# Patient Record
Sex: Male | Born: 1995 | Race: White | Hispanic: No | Marital: Single | Smoking: Never smoker
Health system: Southern US, Community
[De-identification: ages and names within clinical notes are randomized; demographics above are authoritative.]

## PROBLEM LIST (undated history)

## (undated) DIAGNOSIS — F79 Unspecified intellectual disabilities: Secondary | ICD-10-CM

## (undated) DIAGNOSIS — R569 Unspecified convulsions: Secondary | ICD-10-CM

## (undated) DIAGNOSIS — F909 Attention-deficit hyperactivity disorder, unspecified type: Secondary | ICD-10-CM

---

## 2014-10-22 ENCOUNTER — Encounter: Payer: Self-pay | Admitting: *Deleted

## 2014-10-22 ENCOUNTER — Emergency Department: Payer: Medicaid Other

## 2014-10-22 ENCOUNTER — Emergency Department
Admission: EM | Admit: 2014-10-22 | Discharge: 2014-10-23 | Disposition: A | Discharge status: Home / Self Care | Payer: Medicaid Other | Attending: Emergency Medicine | Admitting: Emergency Medicine

## 2014-10-22 DIAGNOSIS — T148XXA Other injury of unspecified body region, initial encounter: Secondary | ICD-10-CM

## 2014-10-22 DIAGNOSIS — Y92410 Unspecified street and highway as the place of occurrence of the external cause: Secondary | ICD-10-CM | POA: Diagnosis not present

## 2014-10-22 DIAGNOSIS — S59901A Unspecified injury of right elbow, initial encounter: Secondary | ICD-10-CM | POA: Diagnosis present

## 2014-10-22 DIAGNOSIS — S70211A Abrasion, right hip, initial encounter: Secondary | ICD-10-CM | POA: Diagnosis not present

## 2014-10-22 DIAGNOSIS — S50311A Abrasion of right elbow, initial encounter: Secondary | ICD-10-CM | POA: Insufficient documentation

## 2014-10-22 DIAGNOSIS — Y998 Other external cause status: Secondary | ICD-10-CM | POA: Insufficient documentation

## 2014-10-22 DIAGNOSIS — S80211A Abrasion, right knee, initial encounter: Secondary | ICD-10-CM | POA: Diagnosis not present

## 2014-10-22 DIAGNOSIS — Y9389 Activity, other specified: Secondary | ICD-10-CM | POA: Diagnosis not present

## 2014-10-22 HISTORY — DX: Unspecified convulsions: R56.9

## 2014-10-22 MED ORDER — IBUPROFEN 800 MG PO TABS
800.0000 mg | ORAL_TABLET | Freq: Once | ORAL | Status: AC
Start: 2014-10-22 — End: 2014-10-22
  Administered 2014-10-22: 800 mg via ORAL
  Filled 2014-10-22: qty 1

## 2014-10-22 MED ORDER — BACITRACIN ZINC 500 UNIT/GM EX OINT
TOPICAL_OINTMENT | CUTANEOUS | Status: AC
  Administered 2014-10-22: 1 via TOPICAL
  Filled 2014-10-22: qty 0.9

## 2014-10-22 MED ORDER — BACITRACIN-NEOMYCIN-POLYMYXIN OINTMENT TUBE: TOPICAL_OINTMENT | Freq: Once | CUTANEOUS | Status: DC

## 2014-10-22 MED ORDER — NEOMYCIN-POLYMYXIN-PRAMOXINE 1 % EX CREA: TOPICAL_CREAM | Freq: Two times a day (BID) | CUTANEOUS | Status: DC

## 2014-10-22 MED ORDER — BACITRACIN ZINC 500 UNIT/GM EX OINT
TOPICAL_OINTMENT | Freq: Once | CUTANEOUS | Status: AC
  Administered 2014-10-22: 1 via TOPICAL
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
## 2014-10-22 NOTE — Discharge Instructions (Signed)
Abrasions An abrasion is a cut or scrape of the skin. Abrasions do not go through all layers of the skin. HOME CARE  If a bandage (dressing) was put on your wound, change it as told by your doctor. If the bandage sticks, soak it off with warm.  Wash the area with water and soap 2 times a day. Rinse off the soap. Pat the area dry with a clean towel.  Put on medicated cream (ointment) as told by your doctor.  Change your bandage right away if it gets wet or dirty.  Only take medicine as told by your doctor.  See your doctor within 24-48 hours to get your wound checked.  Check your wound for redness, puffiness (swelling), or yellowish-white fluid (pus). GET HELP RIGHT AWAY IF:   You have more pain in the wound.  You have redness, swelling, or tenderness around the wound.  You have pus coming from the wound.  You have a fever or lasting symptoms for more than 2-3 days.  You have a fever and your symptoms suddenly get worse.  You have a bad smell coming from the wound or bandage. MAKE SURE YOU:   Understand these instructions.  Will watch your condition.  Will get help right away if you are not doing well or get worse. Document Released: 07/12/2007 Document Revised: 10/18/2011 Document Reviewed: 12/27/2010 ExitCare Patient Information 2015 [employer]. This information is not intended to replace advice given to you by your health care provider. Make sure you discuss any questions you have with your health care provider.  

## 2014-10-23 MED ORDER — NAPROXEN 500 MG PO TABS: 500.0000 mg | ORAL_TABLET | Freq: Two times a day (BID) | ORAL | Status: DC

## 2015-07-11 ENCOUNTER — Encounter: Payer: Self-pay | Admitting: Emergency Medicine

## 2015-07-11 ENCOUNTER — Emergency Department: Payer: Medicaid Other

## 2015-07-11 ENCOUNTER — Emergency Department
Admission: EM | Admit: 2015-07-11 | Discharge: 2015-07-11 | Disposition: A | Discharge status: Home / Self Care | Payer: Medicaid Other | Attending: Emergency Medicine | Admitting: Emergency Medicine

## 2015-07-11 DIAGNOSIS — Z23 Encounter for immunization: Secondary | ICD-10-CM | POA: Diagnosis not present

## 2015-07-11 DIAGNOSIS — Z8669 Personal history of other diseases of the nervous system and sense organs: Secondary | ICD-10-CM | POA: Diagnosis not present

## 2015-07-11 DIAGNOSIS — W540XXA Bitten by dog, initial encounter: Secondary | ICD-10-CM | POA: Diagnosis not present

## 2015-07-11 DIAGNOSIS — Y939 Activity, unspecified: Secondary | ICD-10-CM | POA: Diagnosis not present

## 2015-07-11 DIAGNOSIS — Y999 Unspecified external cause status: Secondary | ICD-10-CM | POA: Diagnosis not present

## 2015-07-11 DIAGNOSIS — Y929 Unspecified place or not applicable: Secondary | ICD-10-CM | POA: Insufficient documentation

## 2015-07-11 DIAGNOSIS — S61411A Laceration without foreign body of right hand, initial encounter: Secondary | ICD-10-CM | POA: Insufficient documentation

## 2015-07-11 DIAGNOSIS — S61451A Open bite of right hand, initial encounter: Secondary | ICD-10-CM | POA: Diagnosis present

## 2015-07-11 MED ORDER — TETANUS-DIPHTH-ACELL PERTUSSIS 5-2.5-18.5 LF-MCG/0.5 IM SUSP
0.5000 mL | Freq: Once | INTRAMUSCULAR | Status: AC
  Administered 2015-07-11: 0.5 mL via INTRAMUSCULAR
  Filled 2015-07-11: qty 0.5

## 2015-07-11 MED ORDER — AMOXICILLIN-POT CLAVULANATE 875-125 MG PO TABS: 1.0000 | ORAL_TABLET | Freq: Two times a day (BID) | ORAL | Status: DC
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
## 2015-07-11 NOTE — ED Notes (Signed)
zeroform applied to wound with gauze and wrapped.

## 2015-07-11 NOTE — Discharge Instructions (Signed)
Delayed Wound Closure Sometimes, your health care provider will decide to delay closing a wound for several days. This is done when the wound is badly bruised, dirty, or when it has been several hours since the injury happened. By delaying the closure of your wound, the risk of infection is reduced. Wounds that are closed in 3-7 days after being cleaned up and dressed heal just as well as those that are closed right away. HOME CARE INSTRUCTIONS  Rest and elevate the injured area until the pain and swelling are gone.  Have your wound checked as instructed by your health care provider. SEEK MEDICAL CARE IF:  You develop unusual or increased swelling or redness around the wound.  You have increasing pain or tenderness.  There is increasing fluid (drainage) or a bad smelling drainage coming from the wound.   This information is not intended to replace advice given to you by your health care provider. Make sure you discuss any questions you have with your health care provider.   Document Released: 01/23/2005 Document Revised: 01/28/2013 Document Reviewed: 07/23/2012 Elsevier Interactive Patient Education 2016 Elsevier Inc.   Nonsutured Laceration Care A laceration is a cut that goes through all layers of the skin and extends into the tissue that is right under the skin. This type of cut is usually stitched up (sutured) or closed with tape (adhesive strips) or skin glue shortly after the injury happens. However, if the wound is dirty or if several hours pass before medical treatment is provided, it is likely that germs (bacteria) will enter the wound. Closing a laceration after bacteria have entered it increases the risk of infection. In these cases, your health care provider may leave the laceration open (nonsutured) and cover it with a bandage. This type of treatment helps prevent infection and allows the wound to heal from the deepest layer of tissue damage up to the surface. An open fracture  is a type of injury that may involve nonsutured lacerations. An open fracture is a break in a bone that happens along with one or more lacerations through the skin that is near the fracture site. HOW TO CARE FOR YOUR NONSUTURED LACERATION  Take or apply over-the-counter and prescription medicines only as told by your health care provider.  If you were prescribed an antibiotic medicine, take or apply it as told by your health care provider. Do not stop using the antibiotic even if your condition improves.  Clean the wound one time each day or as told by your health care provider.  Wash the wound with mild soap and water.  Rinse the wound with water to remove all soap.  Pat your wound dry with a clean towel. Do not rub the wound.  Do not inject anything into the wound unless your health care provider told you to.  Change any bandages (dressings) as told by your health care provider. This includes changing the dressing if it gets wet, dirty, or starts to smell bad.  Keep the dressing dry until your health care provider says it can be removed. Do not take baths, swim, or do anything that puts your wound underwater until your health care provider approves.  Raise (elevate) the injured area above the level of your heart while you are sitting or lying down, if possible.  Do not scratch or pick at the wound.  Check your wound every day for signs of infection. Watch for:  Redness, swelling, or pain.  Fluid, blood, or pus.  Keep all  follow-up visits as told by your health care provider. This is important. SEEK MEDICAL CARE IF:  You received a tetanus and shot and you have swelling, severe pain, redness, or bleeding at the injection site.   You have a fever.  Your pain is not controlled with medicine.  You have increased redness, swelling, or pain at the site of your wound.  You have fluid, blood, or pus coming from your wound.  You notice a bad smell coming from your wound or your  dressing.  You notice something coming out of the wound, such as wood or glass.  You notice a change in the color of your skin near your wound.  You develop a new rash.  You need to change the dressing frequently due to fluid, blood, or pus draining from the wound.  You develop numbness around your wound. SEEK IMMEDIATE MEDICAL CARE IF:  Your pain suddenly increases and is severe.  You develop severe swelling around the wound.  The wound is on your hand or foot and you cannot properly move a finger or toe.  The wound is on your hand or foot and you notice that your fingers or toes look pale or bluish.  You have a red streak going away from your wound.   This information is not intended to replace advice given to you by your health care provider. Make sure you discuss any questions you have with your health care provider.   Document Released: 12/21/2005 Document Revised: 06/09/2014 Document Reviewed: 01/19/2014 Elsevier Interactive Patient Education 2016 [employer]  Tdap Vaccine (Tetanus, Diphtheria and Pertussis): What You Need to Know 1. Why get vaccinated? Tetanus, diphtheria and pertussis are very serious diseases. Tdap vaccine can protect us from these diseases. And, Tdap vaccine given to pregnant women can protect newborn babies against pertussis. TETANUS (Lockjaw) is rare in the [place] today. It causes painful muscle tightening and stiffness, usually all over the body.  It can lead to tightening of muscles in the head and neck so you can't open your mouth, swallow, or sometimes even breathe. Tetanus kills about 1 out of 10 people who are infected even after receiving the best medical care. DIPHTHERIA is also rare in the [place] today. It can cause a thick coating to form in the back of the throat.  It can lead to breathing problems, heart failure, paralysis, and death. PERTUSSIS (Whooping Cough) causes severe coughing spells, which can cause difficulty  breathing, vomiting and disturbed sleep.  It can also lead to weight loss, incontinence, and rib fractures. Up to 2 in 100 adolescents and 5 in 100 adults with pertussis are hospitalized or have complications, which could include pneumonia or death. These diseases are caused by bacteria. Diphtheria and pertussis are spread from person to person through secretions from coughing or sneezing. Tetanus enters the body through cuts, scratches, or wounds. Before vaccines, as many as 200,000 cases of diphtheria, 200,000 cases of pertussis, and hundreds of cases of tetanus, were reported in the [place] each year. Since vaccination began, reports of cases for tetanus and diphtheria have dropped by about 99% and for pertussis by about 80%. 2. Tdap vaccine Tdap vaccine can protect adolescents and adults from tetanus, diphtheria, and pertussis. One dose of Tdap is routinely given at age 411 or 1612. People who did not get Tdap at that age should get it as soon as possible. Tdap is especially important for healthcare professionals and anyone having close contact with a baby  younger than 12 months. Pregnant women should get a dose of Tdap during every pregnancy, to protect the newborn from pertussis. Infants are most at risk for severe, life-threatening complications from pertussis. Another vaccine, called Td, protects against tetanus and diphtheria, but not pertussis. A Td booster should be given every 10 years. Tdap may be given as one of these boosters if you have never gotten Tdap before. Tdap may also be given after a severe cut or burn to prevent tetanus infection. Your doctor or the person giving you the vaccine can give you more information. Tdap may safely be given at the same time as other vaccines. 3. Some people should not get this vaccine  A person who has ever had a life-threatening allergic reaction after a previous dose of any diphtheria, tetanus or pertussis containing vaccine, OR has a severe  allergy to any part of this vaccine, should not get Tdap vaccine. Tell the person giving the vaccine about any severe allergies.  Anyone who had coma or long repeated seizures within 7 days after a childhood dose of DTP or DTaP, or a previous dose of Tdap, should not get Tdap, unless a cause other than the vaccine was found. They can still get Td.  Talk to your doctor if you:  have seizures or another nervous system problem,  had severe pain or swelling after any vaccine containing diphtheria, tetanus or pertussis,  ever had a condition called Guillain-Barr Syndrome (GBS),  aren't feeling well on the day the shot is scheduled. 4. Risks With any medicine, including vaccines, there is a chance of side effects. These are usually mild and go away on their own. Serious reactions are also possible but are rare. Most people who get Tdap vaccine do not have any problems with it. Mild problems following Tdap (Did not interfere with activities)  Pain where the shot was given (about 3 in 4 adolescents or 2 in 3 adults)  Redness or swelling where the shot was given (about 1 person in 5)  Mild fever of at least 100.4F (up to about 1 in 25 adolescents or 1 in 100 adults)  Headache (about 3 or 4 people in 10)  Tiredness (about 1 person in 3 or 4)  Nausea, vomiting, diarrhea, stomach ache (up to 1 in 4 adolescents or 1 in 10 adults)  Chills, sore joints (about 1 person in 10)  Body aches (about 1 person in 3 or 4)  Rash, swollen glands (uncommon) Moderate problems following Tdap (Interfered with activities, but did not require medical attention)  Pain where the shot was given (up to 1 in 5 or 6)  Redness or swelling where the shot was given (up to about 1 in 16 adolescents or 1 in 12 adults)  Fever over 102F (about 1 in 100 adolescents or 1 in 250 adults)  Headache (about 1 in 7 adolescents or 1 in 10 adults)  Nausea, vomiting, diarrhea, stomach ache (up to 1 or 3 people in  100)  Swelling of the entire arm where the shot was given (up to about 1 in 500). Severe problems following Tdap (Unable to perform usual activities; required medical attention)  Swelling, severe pain, bleeding and redness in the arm where the shot was given (rare). Problems that could happen after any vaccine:  People sometimes faint after a medical procedure, including vaccination. Sitting or lying down for about 15 minutes can help prevent fainting, and injuries caused by a fall. Tell your doctor if you feel dizzy,  or have vision changes or ringing in the ears.  Some people get severe pain in the shoulder and have difficulty moving the arm where a shot was given. This happens very rarely.  Any medication can cause a severe allergic reaction. Such reactions from a vaccine are very rare, estimated at fewer than 1 in a million doses, and would happen within a few minutes to a few hours after the vaccination. As with any medicine, there is a very remote chance of a vaccine causing a serious injury or death. The safety of vaccines is always being monitored. For more information, visit: http://floyd.org/ 5. What if there is a serious problem? What should I look for?  Look for anything that concerns you, such as signs of a severe allergic reaction, very high fever, or unusual behavior.  Signs of a severe allergic reaction can include hives, swelling of the face and throat, difficulty breathing, a fast heartbeat, dizziness, and weakness. These would usually start a few minutes to a few hours after the vaccination. What should I do?  If you think it is a severe allergic reaction or other emergency that can't wait, call 9-1-1 or get the person to the nearest hospital. Otherwise, call your doctor.  Afterward, the reaction should be reported to the Vaccine Adverse Event Reporting System (VAERS). Your doctor might file this report, or you can do it yourself through the VAERS web site at  www.vaers.LAgents.no, or by calling 1-901-125-4537. VAERS does not give medical advice.  6. The National Vaccine Injury Compensation Program The [employer] Vaccine Injury Compensation Program (VICP) is a federal program that was created to compensate people who may have been injured by certain vaccines. Persons who believe they may have been injured by a vaccine can learn about the program and about filing a claim by calling 1-707-699-1942 or visiting the VICP website at SpiritualWord.at. There is a time limit to file a claim for compensation. 7. How can I learn more?  Ask your doctor. He or she can give you the vaccine package insert or suggest other sources of information.  Call your local or state health department.  Contact the Centers for Disease Control and Prevention (CDC):  Call 347-422-1914 (1-800-CDC-INFO) or  Visit CDC's website at PicCapture.uy CDC Tdap Vaccine VIS (04/01/13)   This information is not intended to replace advice given to you by your health care provider. Make sure you discuss any questions you have with your health care provider.   Document Released: 07/25/2011 Document Revised: 02/13/2014 Document Reviewed: 05/07/2013 Elsevier Interactive Patient Education 2016 [employer].   Geophysical data processor bites can range from mild to serious. An animal bite can result in a scratch on the skin, a deep open cut, a puncture of the skin, a crush injury, or tearing away of the skin or a body part. A small bite from a house pet will usually not cause serious problems. However, some animal bites can become infected or injure a bone or other tissue.  Bites from certain animals can be more dangerous because of the risk of spreading rabies, which is a serious viral infection. This risk is higher with bites from stray animals or wild animals, such as raccoons, foxes, skunks, and bats. Dogs are responsible for most animal bites. Children are bitten more often  than adults.  SYMPTOMS  Common symptoms of an animal bite include:  Pain.  Bleeding.  Swelling.  Bruising.  DIAGNOSIS  This condition may be diagnosed based on a  physical exam and medical history. Your health care provider will examine the wound and ask for details about the animal and how the bite happened. You may also have tests, such as:  Blood tests to check for infection or to determine if surgery is needed.  X-rays to check for damage to bones or joints.  Culture test. This uses a sample of fluid from the wound to check for infection. TREATMENT  Treatment varies depending on the location and type of animal bite and your medical history. Treatment may include:  Wound care. This often includes cleaning the wound, flushing the wound with saline solution, and applying a bandage (dressing). Sometimes, the wound is left open to heal because of the high risk of infection. However, in some cases, the wound may be closed with stitches (sutures), staples, skin glue, or adhesive strips.  Antibiotic medicine.  Tetanus shot.  Rabies treatment if the animal could have rabies.  In some cases, bites that have become infected may require IV antibiotics and surgical treatment in the hospital.  HOME CARE INSTRUCTIONS  Wound Care  Follow instructions from your health care provider about how to take care of your wound. Make sure you:  Wash your hands with soap and water before you change your dressing. If soap and water are not available, use hand sanitizer.  Change your dressing as told by your health care provider.  Leave sutures, skin glue, or adhesive strips in place. These skin closures may need to be in place for 2 weeks or longer. If adhesive strip edges start to loosen and curl up, you may trim the loose edges. Do not remove adhesive strips completely unless your health care provider tells you to do that. Check your wound every day for signs of infection. Watch for:  Increasing redness, swelling,  or pain.  Fluid, blood, or pus.  General Instructions  Take or apply over-the-counter and prescription medicines only as told by your health care provider.  If you were prescribed an antibiotic, take or apply it as told by your health care provider. Do not stop using the antibiotic even if your condition improves.  Keep the injured area raised (elevated) above the level of your heart while you are sitting or lying down, if this is possible.  If directed, apply ice to the injured area.  Put ice in a plastic bag.  Place a towel between your skin and the bag.  Leave the ice on for 20 minutes, 2-3 times per day.  Keep all follow-up visits as told by your health care provider. This is important.  SEEK MEDICAL CARE IF:  You have increasing redness, swelling, or pain at the site of your wound.  You have a general feeling of sickness (malaise).  You feel nauseous or you vomit.  You have pain that does not get better.  SEEK IMMEDIATE MEDICAL CARE IF:  You have a red streak extending away from your wound.  You have fluid, blood, or pus coming from your wound.  You have a fever or chills.  You have trouble moving your injured area.  You have numbness or tingling extending beyond the wound. This information is not intended to replace advice given to you by your health care provider. Make sure you discuss any questions you have with your health care provider.  Document Released: 10/11/2010 Document Revised: 10/14/2014 Document Reviewed: 06/10/2014  Elsevier Interactive Patient Education [employer]

## 2015-07-11 NOTE — ED Provider Notes (Signed)
[facility] Emergency Department Provider Note  ____________________________________________  Time seen: Approximately 4:43 PM  I have reviewed the triage vital signs and the nursing notes.   HISTORY  Chief Complaint Animal Bite    HPI [name] is a 20 y.o. male , NAD, presents to the emergency department accompanied by his father who assists with history due to the patient having a developmental disability. Patient's father reports that [name] was bitten on the right hand by a family dog when he was trying to untangle the animal from a chain in which it was tethered to a tree. They report the animal is up-to-date on all vaccinations. [name] has not had a tetanus vaccine in approximately 10 years. They've noticed there is pain about the site of the bite but otherwise has had full range of motion of his right hand and fingers. Dog bite has not been reported to animal control at this time.    Past Medical History  Diagnosis Date  . Seizures (HCC)     There are no active problems to display for this patient.   History reviewed. No pertinent past surgical history.  Current Outpatient Rx  Name  Route  Sig  Dispense  Refill  . amoxicillin-clavulanate (AUGMENTIN) 875-125 MG tablet   Oral   Take 1 tablet by mouth 2 (two) times daily.   20 tablet   0     Allergies Review of patient's allergies indicates no known allergies.  No family history on file.  Social History Social History  Substance Use Topics  . Smoking status: Never Smoker   . Smokeless tobacco: None  . Alcohol Use: No     Review of Systems  Constitutional: No fever/chills Cardiovascular: No chest pain. Respiratory: No shortness of breath. No wheezing.  Gastrointestinal: No abdominal pain.  No nausea, vomiting. Musculoskeletal: Positive right hand pain. Skin: Positive open wound to right hand due to dog bite. Negative for rash. Neurological: Negative for headaches, focal  weakness or numbness. No tingling 10-point ROS otherwise negative.  ____________________________________________   PHYSICAL EXAM:  VITAL SIGNS: ED Triage Vitals  Enc Vitals Group     BP 07/11/15 1629 134/79 mmHg     Pulse Rate 07/11/15 1629 76     Resp 07/11/15 1629 18     Temp 07/11/15 1629 98.3 F (36.8 C)     Temp Source 07/11/15 1629 Oral     SpO2 07/11/15 1629 98 %     Weight 07/11/15 1629 129 lb (58.514 kg)     Height 07/11/15 1629 5\' 5"  (1.651 m)     Head Cir --      Peak Flow --      Pain Score 07/11/15 1634 10     Pain Loc --      Pain Edu? --      Excl. in GC? --      Constitutional: Alert and oriented. Well appearing and in no acute distress. Eyes: Conjunctivae are normal. Head: Atraumatic. Cardiovascular: Good peripheral circulation with 2+ pulses noted in the right hand. Capillary refill is brisk in the digits of the right hand. Respiratory: Normal respiratory effort without tachypnea or retractions. Musculoskeletal: Full range of motion of the right wrist, hand, fingers with minimal pain. No lower extremity tenderness nor edema.  No joint effusions. Neurologic:  Normal speech and language. No gross focal neurologic deficits are appreciated. Sensation to light touch about the right hand is grossly intact. Skin:  1.5cm ragged, open wounds to the  medial right hand with bleeding controlled. Skin is warm, dry. No rash noted. Psychiatric: Mood and affect are normal.    ____________________________________________   LABS  None ____________________________________________  EKG  None ____________________________________________  RADIOLOGY I have personally viewed and evaluated these images (plain radiographs) as part of my medical decision making, as well as reviewing the written report by the radiologist.  Dg Hand Complete Right  07/11/2015  CLINICAL DATA:  Bitten by dog EXAM: RIGHT HAND - COMPLETE 3+ VIEW COMPARISON:  None. FINDINGS: Frontal, oblique, and  lateral views were obtained there is a bandage medially. No other radiopaque foreign body evident. There is no fracture or dislocation. The joint spaces appear normal. No erosive change. IMPRESSION: Bandage medially. No other radiopaque foreign body. No fracture or dislocation. No appreciable arthropathy. Electronically Signed   By: Bretta Bang III M.D.   On: 07/11/2015 17:12    ____________________________________________    PROCEDURES  Procedure(s) performed: None    Medications  Tdap (BOOSTRIX) injection 0.5 mL (0.5 mLs Intramuscular Given 07/11/15 1718)     ____________________________________________   INITIAL IMPRESSION / ASSESSMENT AND PLAN / ED COURSE  Pertinent imaging results that were available during my care of the patient were reviewed by me and considered in my medical decision making (see chart for details).  Patient's diagnosis is consistent with laceration of right hand due to dog bite and the need for her to get vaccination. Off duty Regulatory affairs officer did consult with the patient and his father in regards to the dog bite to take a statement. Patient will be discharged home with prescriptions for Augmentin to take twice daily for 10 days to cover for infection. Discussed with the patient and his father who is at the bedside in regards to delayed wound closure due to the increased possibility of significant infection from dog bites. Patient is to keep right hand covered as well as clean and dry over the next 5 days to allow healing of wound. Patient is to follow up with his primary care provider in 48 hours for wound recheck and potential delayed wound closure. Patient is given ED precautions to return to the ED for any worsening or new symptoms.      ____________________________________________  FINAL CLINICAL IMPRESSION(S) / ED DIAGNOSES  Final diagnoses:  Laceration of right hand, initial encounter  Dog bite  Need for Tdap vaccination      NEW  MEDICATIONS STARTED DURING THIS VISIT:  New Prescriptions   AMOXICILLIN-CLAVULANATE (AUGMENTIN) 875-125 MG TABLET    Take 1 tablet by mouth 2 (two) times daily.         Hope Pigeon, PA-C 07/11/15 1733  Loleta Rose, MD 07/11/15 2055

## 2015-07-11 NOTE — ED Notes (Addendum)
Pt. bit by beagal to right hand. Dog up on shots per caregiver. Mom reports she have 2 extra strength tylenol at 1530

## 2017-04-10 IMAGING — DX DG HAND COMPLETE 3+V*R*
3 series · 3 of 3 positions shown · non-contrast
Comparison: None.

CLINICAL DATA: Bitten by dog

EXAM:
RIGHT HAND - COMPLETE 3+ VIEW

[hand ap]
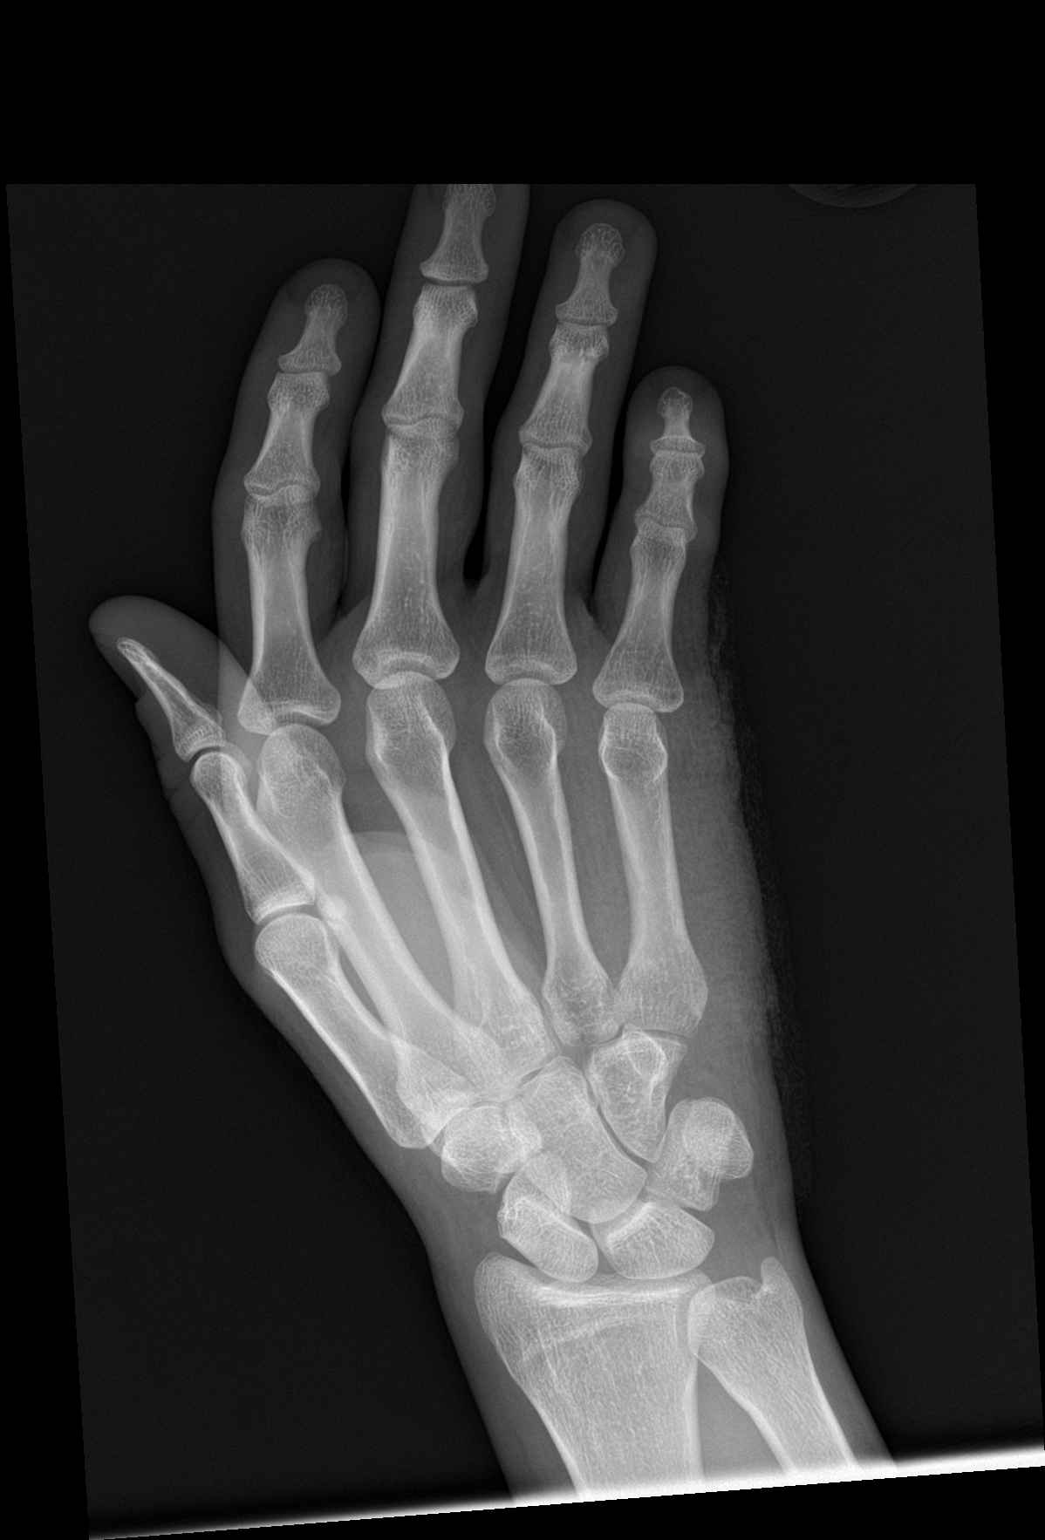

[hand obl]
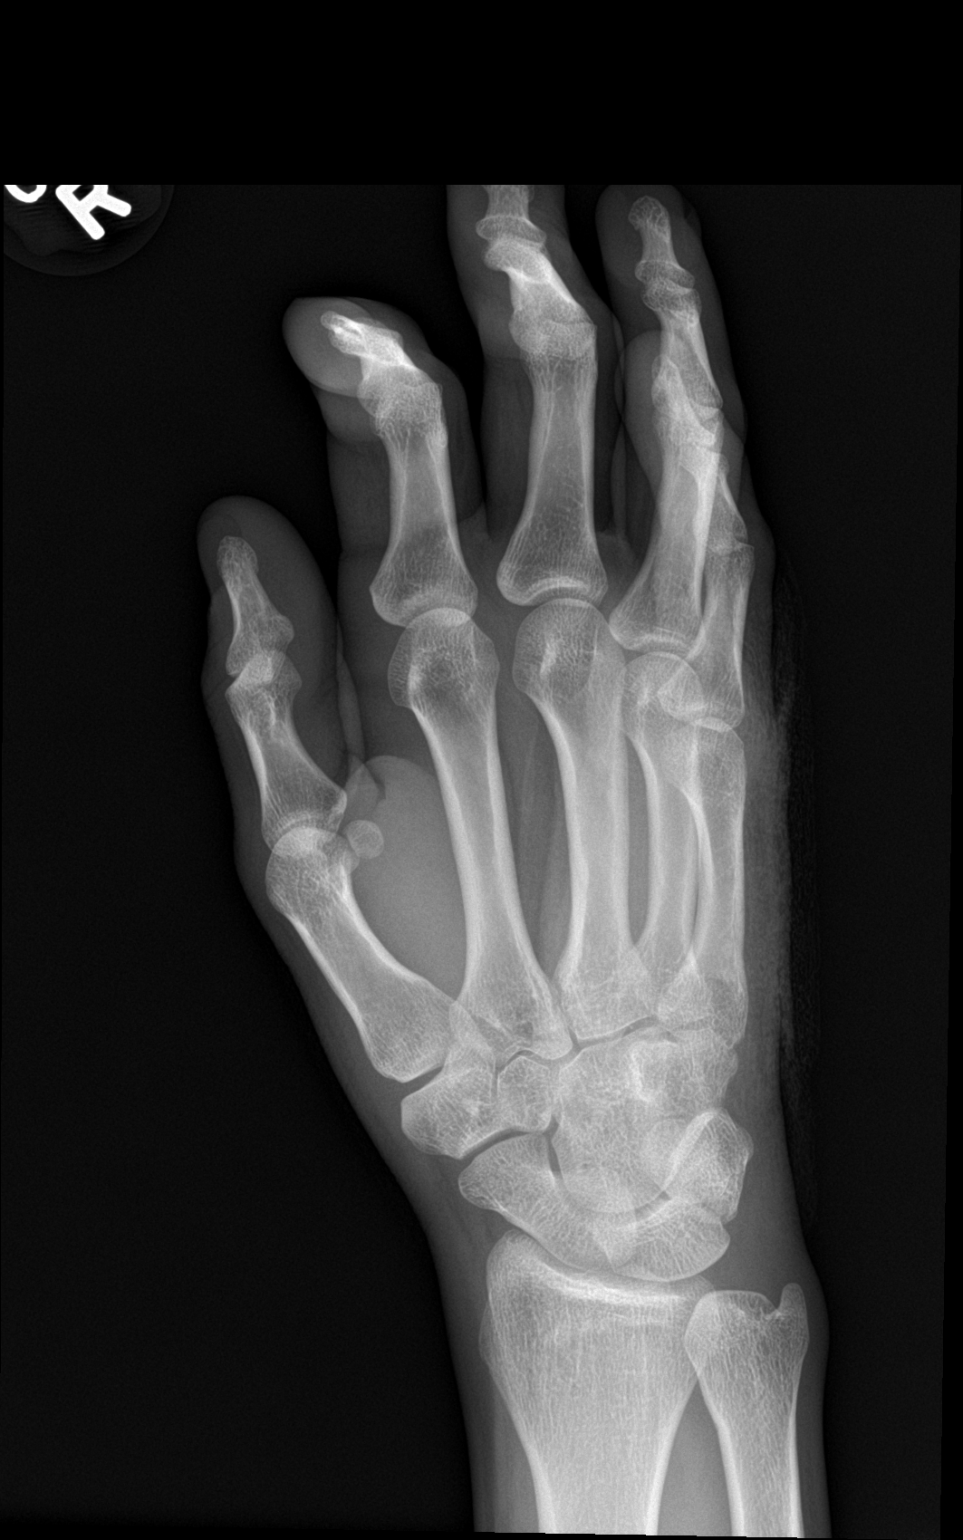

[hand lat]
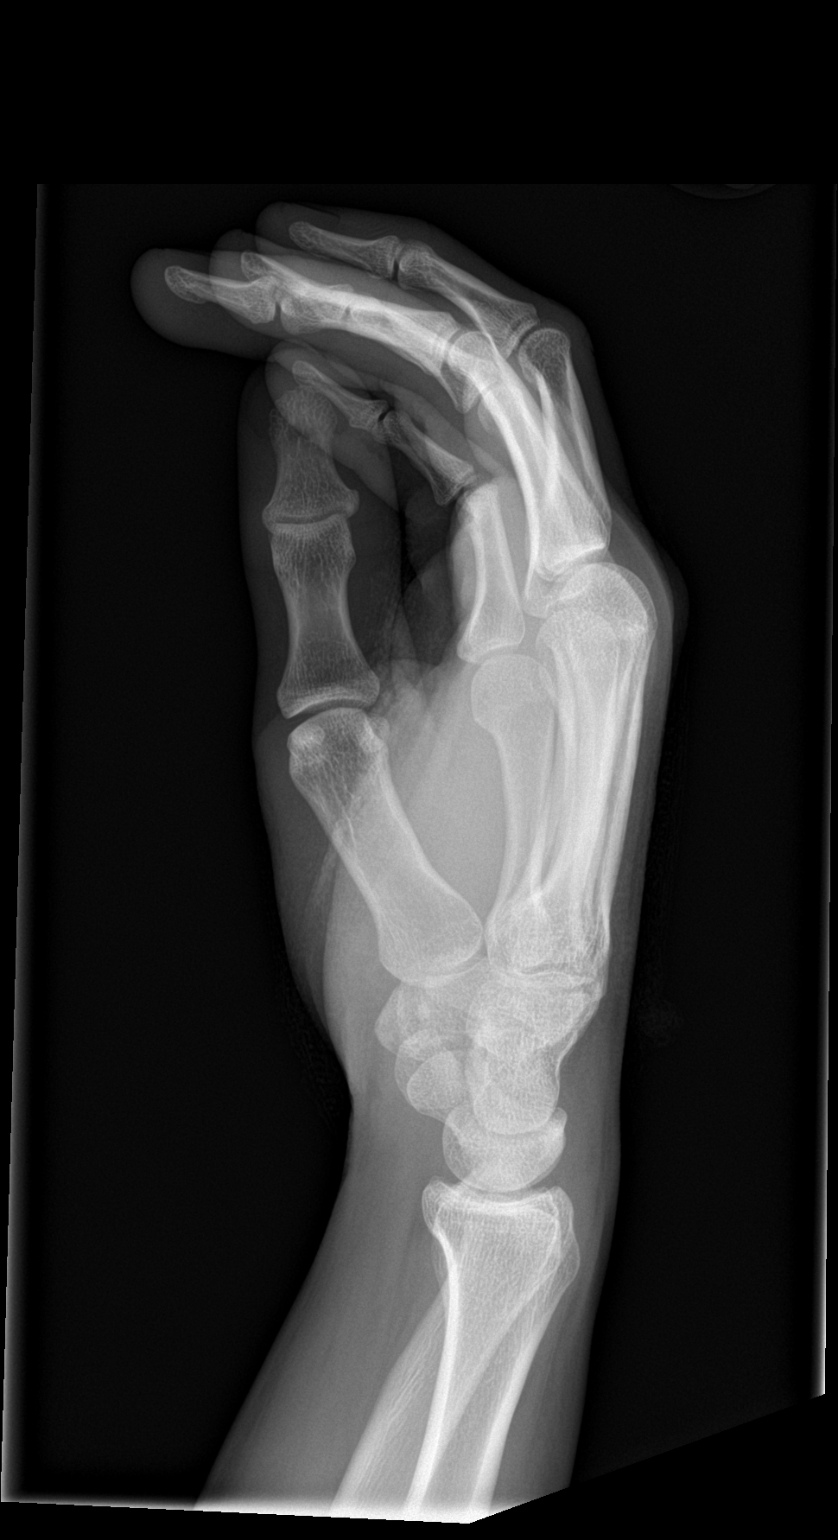

[3 of 3 positions shown; findings below may reference images not displayed]

FINDINGS: Frontal, oblique, and lateral views were obtained there is a bandage
medially. No other radiopaque foreign body evident. There is no
fracture or dislocation. The joint spaces appear normal. No erosive
change.
IMPRESSION: Bandage medially. No other radiopaque foreign body. No fracture or
dislocation. No appreciable arthropathy.

## 2018-02-11 DIAGNOSIS — F902 Attention-deficit hyperactivity disorder, combined type: Secondary | ICD-10-CM | POA: Insufficient documentation

## 2018-02-11 DIAGNOSIS — F39 Unspecified mood [affective] disorder: Secondary | ICD-10-CM | POA: Insufficient documentation

## 2018-02-11 DIAGNOSIS — G40909 Epilepsy, unspecified, not intractable, without status epilepticus: Secondary | ICD-10-CM | POA: Insufficient documentation

## 2021-02-21 ENCOUNTER — Telehealth: Payer: Self-pay | Admitting: *Deleted

## 2021-02-21 NOTE — Telephone Encounter (Signed)
Patient mother called and [facility] stating she is wondering when to turn in and return New pt forms and if its fine for her to turn it in on Tuesday 02/21/2021.   Staff called patient and [facility] informing patient that 02/22/21 is fine to return forms and documents in as long as its well before patient appt with provider. Office number was provided on voicemail.

## 2021-03-22 ENCOUNTER — Other Ambulatory Visit: Payer: Self-pay

## 2021-03-22 ENCOUNTER — Encounter: Payer: Self-pay | Admitting: Psychiatry

## 2021-03-22 ENCOUNTER — Ambulatory Visit (INDEPENDENT_AMBULATORY_CARE_PROVIDER_SITE_OTHER): Payer: Medicare Other | Admitting: Psychiatry

## 2021-03-22 VITALS — BP 133/92 | HR 76 | Temp 98.3°F | Ht 64.96 in | Wt 171.6 lb

## 2021-03-22 DIAGNOSIS — F70 Mild intellectual disabilities: Secondary | ICD-10-CM | POA: Insufficient documentation

## 2021-03-22 DIAGNOSIS — F902 Attention-deficit hyperactivity disorder, combined type: Secondary | ICD-10-CM | POA: Diagnosis not present

## 2021-03-22 DIAGNOSIS — Z9189 Other specified personal risk factors, not elsewhere classified: Secondary | ICD-10-CM

## 2021-03-22 DIAGNOSIS — Z79899 Other long term (current) drug therapy: Secondary | ICD-10-CM | POA: Insufficient documentation

## 2021-03-22 DIAGNOSIS — F39 Unspecified mood [affective] disorder: Secondary | ICD-10-CM

## 2021-03-22 DIAGNOSIS — R569 Unspecified convulsions: Secondary | ICD-10-CM | POA: Insufficient documentation

## 2021-03-22 MED ORDER — ZIPRASIDONE HCL 20 MG PO CAPS: 20.0000 mg | ORAL_CAPSULE | Freq: Every day | ORAL | 0 refills | Status: DC

## 2021-03-22 MED ORDER — QUETIAPINE FUMARATE 25 MG PO TABS: 25.0000 mg | ORAL_TABLET | Freq: Every day | ORAL | 0 refills | Status: DC

## 2021-03-22 NOTE — Progress Notes (Signed)
Psychiatric Initial Adult Assessment   Patient Identification: [name] MRN:  [id number] Date of Evaluation:  03/22/2021 Referral Source: Jonetta SpeakWarren Bonney MD Chief Complaint:   Chief Complaint  Patient presents with   Establish Care 26 year old Caucasian male, with history of anger issues, ADHD, intellectual disability, presented to establish care.      Visit Diagnosis:    ICD-10-CM   1. Episodic mood disorder (HCC)  F39 ziprasidone (GEODON) 20 MG capsule    QUEtiapine (SEROQUEL) 25 MG tablet    Valproic acid level    Sodium    BUN+Creat    Hepatic function panel    Platelet count    TSH    Lithium level    Prolactin    2. Attention deficit hyperactivity disorder (ADHD), combined type  F90.2     3. Mild intellectual disabilities  F70     4. At risk for prolonged QT interval syndrome  Z91.89 EKG 12-Lead    5. High risk medication use  Z79.899 Valproic acid level    Sodium    BUN+Creat    Hepatic function panel    Platelet count    TSH    Lithium level    Prolactin      History of Present Illness:  Anthony EaringDavid Heffern is a 26 year old Caucasian male, single, on disability, lives in [place] with his parents, presented to establish care.  Patient has a history of anger issues, ADHD ,mild intellectual disability ,seizures ( last one was at the age of 89 ).  Patient's mother Clydie BraunKaren -provided collateral information-mother reported she is the legal guardian for patient.  Patient today although appeared to be anxious, agitated on and off was redirectable and cooperated.  According to mother patient was under the care of [facility], was diagnosed with ADHD, mild intellectual disability and unspecified mood disorder and was prescribed medications like Geodon 80 mg twice a day, lithium 300 mg daily, Lexapro 20 mg daily, Depakote 500 mg twice a day, Ritalin 10 mg daily in the morning and Metadate 50 mg daily in the morning.  Patient in spite of being on all these  medications continues to have episodes of anger issues, impulsivity.  He has anger issues especially at home when he is around his family.  There has been episodes when he was so angry that there was damage to property at home, this may have been couple of years ago.  Patient currently works at [employer] as well as [employer]  4 days a week.  When he is at work he is able to manage his anger better.  According to mother she wanted to get some help with his anger issues and that is why decided to change providers.   Patient denies any sadness or anhedonia.  Does report increased appetite, likes to eat when he is bored.  Also reports sleep problems.  He is currently on multiple medications which he takes at night which does help him to fall asleep however he wakes up early and is unable to fall back asleep.  Denies any suicidality, homicidality or perceptual disturbances.  Patient is a Product/process development scientistworrier, worries about everything all the time, is often anxious, nervous, restless.  This has been going on since the past several years, does not believe the current medications has controlled his anxiety well enough.  Denies any substance abuse problems.  Denies any side effects to current medications.  As per mother patient was referred for services in the past, he was in the [facility]  program previously however had to leave it because of not following rules.  He was also referred to psychotherapist in the past however he does not stay compliant.    Patient does have a history of seizures, all were per report last seizure was at the age of 5 years.    Associated Signs/Symptoms: Depression Symptoms:  insomnia, anxiety, decreased appetite, (Hypo) Manic Symptoms:  Impulsivity, Irritable Mood, Anxiety Symptoms:  Excessive Worry, Psychotic Symptoms:   Denies PTSD Symptoms: Negative  Past Psychiatric History: Patient was under the care of [facility] previously, Dr. Janeece Riggers.  I have reviewed notes  per Dr. Janeece Riggers at [facility].  History of being under the care of [facility] in [place] and Dr. Ave Filter in [place] for his ADHD, behavioral problems.  Seen by Dr. Lynelle Smoke and [facility] for psych testing.  Denies inpatient mental health admissions.  Denies suicide attempts or self-harm.  Previous Psychotropic Medications: Yes past trials of medications like Prozac, Abilify, risperidone, Latuda, Intuniv, Depakote, Ritalin, Geodon.  Substance Abuse History in the last 12 months:  No.  Consequences of Substance Abuse: Negative  Past Medical History:  Past Medical History:  Diagnosis Date   Seizures (HCC)    last seizure was at the age of 5   History reviewed. No pertinent surgical history.  Family Psychiatric History: Patient was adopted, unknown family history-biological.  Denies mental health problems in his adoptive family.  Family History:  Family History  Problem Relation Age of Onset   Mental illness Neg Hx     Social History:   Social History   Socioeconomic History   Marital status: Single    Spouse name: Not on file   Number of children: Not on file   Years of education: Not on file   Highest education level: Not on file  Occupational History   Not on file  Tobacco Use   Smoking status: Never    Passive exposure: Never   Smokeless tobacco: Not on file  Vaping Use   Vaping Use: Never used  Substance and Sexual Activity   Alcohol use: No   Drug use: Never   Sexual activity: Not Currently  Other Topics Concern   Not on file  Social History Narrative   Not on file   Social Determinants of Health   Financial Resource Strain: Not on file  Food Insecurity: Not on file  Transportation Needs: Not on file  Physical Activity: Not on file  Stress: Not on file  Social Connections: Not on file    Additional Social History: Patient was born in [place].  He was adopted by his family at the age of 13.  He has an adopted sister and his adoptive parents also have a  biological child-a daughter.  Patient was born premature, delayed developmentally.  Patient graduated high school, was in special classes.  Patient was in [facility] program previously however did not follow rules and was let go.  Patient is single.  He does not have any children.  His mother is his legal guardian.  Currently lives in [place] with his parents.  Currently works at [employer] as well as [employer] 4 days a week.  Enjoys bowling.  Allergies:  No Known Allergies  Metabolic Disorder Labs: No results found for: HGBA1C, MPG No results found for: PROLACTIN No results found for: CHOL, TRIG, HDL, CHOLHDL, VLDL, LDLCALC No results found for: TSH  Therapeutic Level Labs: No results found for: LITHIUM No results found for: CBMZ No results  found for: VALPROATE  Current Medications: Current Outpatient Medications  Medication Sig Dispense Refill   divalproex (DEPAKOTE) 500 MG DR tablet Take 500 mg by mouth 2 (two) times daily.     escitalopram (LEXAPRO) 20 MG tablet Take 20 mg by mouth daily.     lithium 300 MG tablet Take by mouth.     Melatonin 10 MG TBCR Take by mouth.     methylphenidate (METADATE CD) 50 MG CR capsule Take 50 mg by mouth every morning.     methylphenidate (RITALIN) 10 MG tablet Take 10 mg by mouth every morning.     QUEtiapine (SEROQUEL) 25 MG tablet Take 1 tablet (25 mg total) by mouth at bedtime. 30 tablet 0   ziprasidone (GEODON) 20 MG capsule Take 1 capsule (20 mg total) by mouth daily with breakfast. Stop after 7 days. 7 capsule 0   No current facility-administered medications for this visit.    Musculoskeletal: Strength & Muscle Tone: within normal limits Gait & Station: normal Patient leans: N/A  Psychiatric Specialty Exam: Review of Systems  Unable to perform ROS: Other (patient with  intellectual disability , unable to assess)   Blood pressure (!) 133/92, pulse 76, temperature 98.3 F (36.8 C), temperature source Temporal, height 5' 4.96" (1.65 m),  weight 171 lb 9.6 oz (77.8 kg).Body mass index is 28.59 kg/m.  General Appearance: Casual  Eye Contact:  Fair  Speech:  Clear and Coherent  Volume:  Normal  Mood:  Anxious and Irritable  Affect:  Full Range  Thought Process:  Goal Directed and Descriptions of Associations: Intact  Orientation:  Full (Time, Place, and Person)  Thought Content:  Logical  Suicidal Thoughts:  No  Homicidal Thoughts:  No  Memory:  Immediate;   Fair Recent;   Fair Remote;   limited  Judgement:  Fair  Insight:  Shallow  Psychomotor Activity:  Restlessness  Concentration:  Concentration: Fair and Attention Span: Fair  Recall:  [employer] of Knowledge: limited  Language: Fair  Akathisia:  No  Handed:  Right  AIMS (if indicated):  done, 0  Assets:  Desire for Improvement Social Support Transportation  ADL's:  Intact  Cognition: baseline  Sleep:  Poor   Screenings: Oceanographer Row Office Visit from [facility]/22/20[facility]  PHQ-2 Total Score 0       Assessment and Plan: Raekwon Winkowski is a 26 year old Caucasian male, single, on disability, has a history of ADHD, mood disorder unspecified, intellectual disability, lives in [place] with his parents, his mother is his legal guardian, presented to establish care.  Patient on multiple psychotropic medications continues to have behavioral problems, anger issues, will benefit from the following plan. The patient demonstrates the following risk factors for suicide: Chronic risk factors for suicide include: psychiatric disorder of mood do . Acute risk factors for suicide include: N/A. Protective factors for this patient include: positive social support. Considering these factors, the overall suicide risk at this point appears to be low. Patient is appropriate for outpatient follow up.  Plan  Episodic mood disorder-unstable Taper of Geodon.  We will reduce Geodon to 20 mg p.o. daily with breakfast for the next 7 days  and stop. Start Seroquel 25 mg p.o. nightly Continue lithium 300 mg p.o. daily Continue Depakote 500 mg p.o. twice daily Continue Lexapro 20 mg p.o. daily  ADHD-stable Continue Metadate CD50 milligrams p.o. daily in the morning Continue Ritalin 10 mg p.o. daily. Patient on multiple psychotropics  including stimulants with potential to lower seizure threshold.  Patient with history of seizures will need neurology evaluation and recommendations about his current psychotropic medications.  Will refer him for neurology consultation.  Mild intellectual disability-chronic-will benefit from referral to psychotherapy.  Will refer to our therapist. Patient has good support system from his family. Mother is his legal guardian.  At risk for prolonged QT syndrome-we will order EKG.  Provided phone #[phone].  High risk medication use-will order Depakote level, sodium, prolactin, platelet count, lithium level, BUN/creatinine, TSH, hepatic function panel.  Patient advised to go to the lab early in the morning prior to taking his Depakote and lithium.  Provided lab slip.  Provided medication education including discussed with Drug to Drug interaction with the current psychotropics including serotonin syndrome.  Collateral information obtained from mother as noted above.  Reviewed medical records from [facility] Dr.Su-as noted above.     Collaboration of Care: Referral or follow-up with counselor/therapist A[facility] at [facility] and Other neurology since he has a history of seizures and is on multiple psychotropics which could lower seizure threshold. Patient/Guardian was advised Release of Information must be obtained prior to any record release in order to collaborate their care with an outside provider. Patient/Guardian was advised if they have not already done so to contact the registration department to sign all necessary forms in order for [place] to release information regarding their care.   Consent: Patient/Guardian gives verbal consent for treatment and assignment of benefits for services provided during this telehealth visit. Patient/Guardian expressed understanding and agreed to proceed.     Follow-up in clinic in 2 weeks or sooner if needed.  This note was generated in part or whole with voice recognition software. Voice recognition is usually quite accurate but there are transcription errors that can and very often do occur. I apologize for any typographical errors that were not detected and corrected.      Jomarie Longs, MD 2/15/20238:17 AM

## 2021-03-22 NOTE — Patient Instructions (Addendum)
Quetiapine Tablets What is this medication? QUETIAPINE (kwe TYE a peen) treats schizophrenia and bipolar disorder. It works by balancing the levels of dopamine and serotonin in your brain, hormones that help regulate mood, behaviors, and thoughts. It belongs to a group of medications called antipsychotics. Antipsychotic medications can be used to treat several kinds of mental health conditions. This medicine may be used for other purposes; ask your health care provider or pharmacist if you have questions. COMMON BRAND NAME(S): Seroquel What should I tell my care team before I take this medication? They need to know if you have any of these conditions: Blockage in your bowel Cataracts Constipation Dementia Diabetes Difficulty swallowing Glaucoma Heart disease High levels of prolactin History of breast cancer History of irregular heartbeat Liver disease Low blood counts, like low white cell, platelet, or red cell counts Low blood pressure Parkinson's disease Prostate disease Seizures Suicidal thoughts, plans or attempt; a previous suicide attempt by you or a family member Thyroid disease Trouble passing urine An unusual or allergic reaction to quetiapine, other medications, foods, dyes, or preservatives Pregnant or trying to get pregnant Breast-feeding How should I use this medication? Take this medication by mouth. Swallow it with a drink of water. Follow the directions on the prescription label. If it upsets your stomach you can take it with food. Take your medication at regular intervals. Do not take it more often than directed. Do not stop taking except on the advice of your care team. A special MedGuide will be given to you by the pharmacist with each prescription and refill. Be sure to read this information carefully each time. Talk to your care team about the use of this medication in children. While this medication may be prescribed for children as young as 10 years for  selected conditions, precautions do apply. Patients over age 28 years may have a stronger reaction to this medication and need smaller doses. Overdosage: If you think you have taken too much of this medicine contact a poison control center or emergency room at once. NOTE: This medicine is only for you. Do not share this medicine with others. What if I miss a dose? If you miss a dose, take it as soon as you can. If it is almost time for your next dose, take only that dose. Do not take double or extra doses. What may interact with this medication? Do not take this medication with any of the following: Cisapride Dronedarone Metoclopramide Pimozide Thioridazine This medication may also interact with the following: Alcohol Antihistamines for allergy, cough, and cold Atropine Avasimibe Certain antivirals for HIV or hepatitis Certain medications for anxiety or sleep Certain medications for bladder problems like oxybutynin, tolterodine Certain medications for depression like amitriptyline, fluoxetine, nefazodone, sertraline Certain medications for fungal infections like fluconazole, ketoconazole, itraconazole, posaconazole Certain medications for stomach problems like dicyclomine, hyoscyamine Certain medications for travel sickness like scopolamine Cimetidine General anesthetics like halothane, isoflurane, methoxyflurane, propofol Ipratropium Levodopa or other medications for Parkinson's disease Medications for blood pressure Medications for seizures Medications that relax muscles for surgery Narcotic medications for pain Other medications that prolong the QT interval (cause an abnormal heart rhythm) Phenothiazines like chlorpromazine, prochlorperazine Rifampin St. John's Wort This list may not describe all possible interactions. Give your health care provider a list of all the medicines, herbs, non-prescription drugs, or dietary supplements you use. Also tell them if you smoke, drink  alcohol, or use illegal drugs. Some items may interact with your medicine. What should I watch for  while using this medication? °Visit your care team for regular checks on your progress. Tell your care team if symptoms do not start to get better or if they get worse. Do not stop taking except on your care team's advice. You may develop a severe reaction. Your care team will tell you how much medication to take. °You may need to have an eye exam before and during use of this medication. °This medication may increase blood sugar. Ask your care team if changes in diet or medications are needed if you have diabetes. °Patients and their families should watch out for new or worsening depression or thoughts of suicide. Also watch out for sudden or severe changes in feelings such as feeling anxious, agitated, panicky, irritable, hostile, aggressive, impulsive, severely restless, overly excited and hyperactive, or not being able to sleep. If this happens, especially at the beginning of antidepressant treatment or after a change in dose, call your care team. °You may get dizzy or drowsy. Do not drive, use machinery, or do anything that needs mental alertness until you know how this medication affects you. Do not stand or sit up quickly, especially if you are an older patient. This reduces the risk of dizzy or fainting spells. Alcohol may interfere with the effect of this medication. Avoid alcoholic drinks. °This medication can cause problems with controlling your body temperature. It can lower the response of your body to cold temperatures. If possible, stay indoors during cold weather. If you must go outdoors, wear warm clothes. It can also lower the response of your body to heat. Do not overheat. Do not over-exercise. Stay out of the sun when possible. If you must be in the sun, wear cool clothing. Drink plenty of water. If you have trouble controlling your body temperature, call your care team right away. °What side  effects may I notice from receiving this medication? °Side effects that you should report to your care team as soon as possible: °Allergic reactions--skin rash, itching, hives, swelling of the face, lips, tongue, or throat °Heart rhythm changes--fast or irregular heartbeat, dizziness, feeling faint or lightheaded, chest pain, trouble breathing °High blood sugar (hyperglycemia)--increased thirst or amount of urine, unusual weakness or fatigue, blurry vision °High fever, stiff muscles, increased sweating, fast or irregular heartbeat, and confusion, which may be signs of neuroleptic malignant syndrome °High prolactin level--unexpected breast tissue growth, discharge from the nipple, change in sex drive or performance, irregular menstrual cycle °Increase in blood pressure in children °Infection--fever, chills, cough, or sore throat °Low blood pressure--dizziness, feeling faint or lightheaded, blurry vision °Low thyroid levels (hypothyroidism)--unusual weakness or fatigue, increased sensitivity to cold, constipation, hair loss, dry skin, weight gain, feelings of depression °Pain or trouble swallowing °Seizures °Stroke--sudden numbness or weakness of the face, arm, or leg, trouble speaking, confusion, trouble walking, loss of balance or coordination, dizziness, severe headache, change in vision °Sudden eye pain or change in vision such as blurry vision, seeing halos around lights, vision loss °Thoughts of suicide or self-harm, worsening mood, feelings of depression °Trouble passing urine °Uncontrolled and repetitive body movements, muscle stiffness or spasms, tremors or shaking, loss of balance or coordination, restlessness, shuffling walk, which may be signs of extrapyramidal symptoms (EPS) °Side effects that usually do not require medical attention (report to your care team if they continue or are bothersome): °Constipation °Dizziness °Drowsiness °Dry mouth °Weight gain °This list may not describe all possible side  effects. Call your doctor for medical advice about side effects. You may report side   effects to FDA at 1-800-FDA-1088. Where should I keep my medication? Keep out of the reach of children. Store at room temperature between 15 and 30 degrees C (59 and 86 degrees F). Throw away any unused medication after the expiration date. NOTE: This sheet is a summary. It may not cover all possible information. If you have questions about this medicine, talk to your doctor, pharmacist, or health care provider.  2022 Elsevier/Gold Standard (2020-10-12 00:00:00)   PLEASE CALL [phone] FOR EKG.

## 2021-03-26 LAB — PROLACTIN: Prolactin: 3.2 ng/mL — ABNORMAL LOW (ref 4.0–15.2)

## 2021-03-26 LAB — BUN+CREAT
BUN/Creatinine Ratio: 15 (ref 9–20)
BUN: 15 mg/dL (ref 6–20)
Creatinine, Ser: 1.01 mg/dL (ref 0.76–1.27)
eGFR: 105 mL/min/{1.73_m2} (ref >59)

## 2021-03-26 LAB — TSH: TSH: 3.04 u[IU]/mL (ref 0.450–4.500)

## 2021-03-26 LAB — VALPROIC ACID LEVEL: Valproic Acid Lvl: 35 ug/mL — ABNORMAL LOW (ref 50–100)

## 2021-03-26 LAB — SODIUM: Sodium: 137 mmol/L (ref 134–144)

## 2021-03-26 LAB — PLATELET COUNT: Platelets: 247 10*3/uL (ref 150–450)

## 2021-03-28 ENCOUNTER — Ambulatory Visit
Admission: RE | Admit: 2021-03-28 | Discharge: 2021-03-28 | Disposition: A | Discharge status: Home / Self Care | Payer: Medicare Other | Source: Ambulatory Visit | Attending: Family Medicine | Admitting: Family Medicine

## 2021-03-28 DIAGNOSIS — Z9189 Other specified personal risk factors, not elsewhere classified: Secondary | ICD-10-CM | POA: Diagnosis present

## 2021-03-29 ENCOUNTER — Ambulatory Visit: Payer: Self-pay

## 2021-04-07 ENCOUNTER — Ambulatory Visit (INDEPENDENT_AMBULATORY_CARE_PROVIDER_SITE_OTHER): Payer: Medicare Other | Admitting: Psychiatry

## 2021-04-07 ENCOUNTER — Other Ambulatory Visit: Payer: Self-pay

## 2021-04-07 ENCOUNTER — Encounter: Payer: Self-pay | Admitting: Psychiatry

## 2021-04-07 VITALS — BP 143/88 | HR 71 | Temp 97.3°F | Wt 167.6 lb

## 2021-04-07 DIAGNOSIS — R030 Elevated blood-pressure reading, without diagnosis of hypertension: Secondary | ICD-10-CM

## 2021-04-07 DIAGNOSIS — Z9189 Other specified personal risk factors, not elsewhere classified: Secondary | ICD-10-CM

## 2021-04-07 DIAGNOSIS — F39 Unspecified mood [affective] disorder: Secondary | ICD-10-CM | POA: Diagnosis not present

## 2021-04-07 DIAGNOSIS — Z79899 Other long term (current) drug therapy: Secondary | ICD-10-CM | POA: Diagnosis not present

## 2021-04-07 DIAGNOSIS — F70 Mild intellectual disabilities: Secondary | ICD-10-CM | POA: Diagnosis not present

## 2021-04-07 DIAGNOSIS — F902 Attention-deficit hyperactivity disorder, combined type: Secondary | ICD-10-CM | POA: Diagnosis not present

## 2021-04-07 MED ORDER — METHYLPHENIDATE HCL 10 MG PO TABS: 10.0000 mg | ORAL_TABLET | Freq: Every morning | ORAL | 0 refills | Status: DC

## 2021-04-07 MED ORDER — METHYLPHENIDATE HCL ER (CD) 50 MG PO CPCR: 50.0000 mg | ORAL_CAPSULE | Freq: Every morning | ORAL | 0 refills | Status: DC

## 2021-04-07 MED ORDER — DIVALPROEX SODIUM 250 MG PO DR TAB: 250.0000 mg | DELAYED_RELEASE_TABLET | Freq: Every day | ORAL | 0 refills | Status: DC

## 2021-04-07 MED ORDER — DIVALPROEX SODIUM 500 MG PO DR TAB: 500.0000 mg | DELAYED_RELEASE_TABLET | Freq: Two times a day (BID) | ORAL | 0 refills | Status: DC

## 2021-04-07 MED ORDER — LITHIUM CARBONATE 300 MG PO TABS: 300.0000 mg | ORAL_TABLET | ORAL | 0 refills | Status: DC

## 2021-04-07 MED ORDER — QUETIAPINE FUMARATE 25 MG PO TABS: 25.0000 mg | ORAL_TABLET | Freq: Every day | ORAL | 1 refills | Status: DC

## 2021-04-07 NOTE — Progress Notes (Signed)
[facility] MD OP Progress Note  04/07/2021 5:28 PM [name]  MRN:  [id number]  Chief Complaint:  Chief Complaint  Patient presents with   Follow-up: 26 year old Caucasian male with history of episodic mood disorder, ADHD, mild intellectual disabilities, presented for medication management.   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  Patient's mother [name]-provided collateral information.  Mother being the legal guardian for patient.(reviewed letter of appointment from [facility]-patient's parents Mr. Tuck Tin and Ms.[name] Whicker are legal guardians-12/11/2017.)  Patient today appeared to be irritable, withdrawn, had to be redirected several times in session today.  Patient was upset about the fact that he had to leave work to come for the appointment.  Patient during the visit was observed as having conversations with his mother regarding the same.  Patient today reports he is tolerating the Seroquel well.  Sleep is good.  It does make him mildly groggy however he continues to be compliant on it.  Agreeable to giving it more time.  Patient otherwise denied any concerns.  As per mother she has noticed his mood symptoms is getting worse since being off of the Geodon.  She however is agreeable to giving the Seroquel more time .  Mother reports they have an appointment with neurology in May for consultation for history of seizures, he agrees to keep it. ( Patient's mother also wanted to share confidential information that patient has been having more and more behavioral problems at home, inappropriate, sexual behaviors which is having an impact on her ability to support him at home.  Mother  is interested in possible group home placement.)  Reviewed and discussed labs including Depakote level most recent-03/25/2021-subtherapeutic at 35, discussed increasing the dosage.  Agreeable to doing so.  Also agreeable to getting labs that were missed including hepatic function as well as lithium level completed.  Patient with elevated blood pressure reading in session today likely because he is agitated.  His blood pressure was better last visit.  We will continue to monitor since he is on medications like stimulants.  We will coordinate care with primary care provider.  Most recent EKG was within normal limits.    Visit Diagnosis:    ICD-10-CM   1. Episodic mood disorder (HCC)  F39 Valproic acid level    divalproex (DEPAKOTE) 250 MG DR tablet    divalproex (DEPAKOTE) 500 MG DR tablet    QUEtiapine (SEROQUEL) 25 MG tablet    lithium 300 MG tablet    2. Attention deficit hyperactivity disorder (ADHD), combined type  F90.2 methylphenidate (RITALIN) 10 MG tablet    methylphenidate (METADATE CD) 50 MG CR capsule    3. Mild intellectual disability  F70     4. High risk medication use  Z79.899 Valproic acid level    divalproex (DEPAKOTE) 500 MG DR tablet    5. At risk for prolonged QT interval syndrome  Z91.89     6. Elevated blood pressure reading  R03.0       Past Psychiatric History: Reviewed past psychiatric history from progress note on 03/22/2021.  Patient has been to multiple psychiatric outpatient clinics in the past including [facility], [facility] as well as therapist Ms. Jennelle Human.  Patient  was under the care of [facility] both recently-Dr.Su   Also reviewed most recent records brought into the session by mother-care-past trials of medications like Prozac, Abilify, risperidone, Latuda, Intuniv, Depakote, Ritalin, Geodon Zyprexa, Haldol, Thorazine.  As per medical certification record review from the time  of this adoption-patient was adopted from [place] at the age of 26 years old.  Patient at that time was not verbal or toilet trained and displayed gross motor skill deficits.His parents likely had mental, other illnesses as well as bad habits like smoking and alcoholism during pregnancy and delivery-patient at the time of delivery was diagnosed with perinatal pathology of the CNS, hydrocephalic syndrome, syndrome of diffuse muscular hypertonus.  I have reviewed notes for [facility] of neurology-08/22/1999-patient was diagnosed with developmental delay, encephalopathy, attention deficit disorder of childhood, epilepsy partial with impairment of consciousness, borderline microcephalia's, lack of coordination.  At that time patient was on medications like albuterol, Claritin, Depakote and Ritalin.  Patient was advised to continue the Ritalin as well as all his other medications at that visit.  I have reviewed his neuropsychological testing from [facility]-dated 07/28/2016-Dr. Kathryne Gin was diagnosed with intellectual disability, moderate-IQ equals 55-low range.  It was advised that the patient will require supervision and services to ensure a basic quality of life.   I have reviewed notes per [facility] dated October 02, 2016-based on their neuropsychological testing-physician assistant with Jennelle Human, Dr. Aram Beecham Gualtieri-patient diagnosed based on neuro cognitive testing with mild intellectual disability,  ADD and mood disorder.  Patient also has a history of generalized tonic-clonic seizures.  I have reviewed notes for [facility]-dated 08/25/2015-11/23/2015-Dr. Julien Nordmann Lateef-patient with history of ADHD: Mild intellectual disability, bipolar disorder unspecified-was continued on medications like methylphenidate 10 mg, Ritalin LA 30 mg, Depakote 250 , 3 times a day.   .  Past Medical History:  Past Medical History:  Diagnosis Date    Seizures ([facility])    last seizure was at the age of 8   History reviewed. No pertinent surgical history.  Family Psychiatric History: Reviewed my progress note from 03/22/2021.  Family History:  Family History  Problem Relation Age of Onset   Mental illness Neg Hx     Social History: Reviewed my progress note from 03/22/2021. Social History   Socioeconomic History   Marital status: Single    Spouse name: Not on file   Number of children: Not on file   Years of education: Not on file   Highest education level: Not on file  Occupational History   Not on file  Tobacco Use   Smoking status: Never    Passive exposure: Never   Smokeless tobacco: Not on file  Vaping Use   Vaping Use: Never used  Substance and Sexual Activity   Alcohol use: No   Drug use: Never   Sexual activity: Not Currently  Other Topics Concern   Not on file  Social History Narrative   PATIENT HAS LEGAL GUARDIAN - [name],[name] and Santiago Glad ( parents )    Social Determinants of Health   Financial Resource Strain: Not on file  Food Insecurity: Not on file  Transportation Needs: Not on file  Physical Activity: Not on file  Stress: Not on file  Social Connections: Not on file    Allergies: No Known Allergies  Metabolic Disorder Labs: No results found for: HGBA1C, MPG Lab Results  Component Value Date   PROLACTIN 3.2 (L) 03/25/2021   No results found for:  CHOL, TRIG, HDL, CHOLHDL, VLDL, LDLCALC Lab Results  Component Value Date   TSH 3.040 03/25/2021    Therapeutic Level Labs: No results found for: LITHIUM Lab Results  Component Value Date   VALPROATE 35 (L) 03/25/2021   No components found for:  CBMZ  Current Medications: Current Outpatient Medications  Medication Sig Dispense Refill   divalproex (DEPAKOTE) 250 MG DR tablet Take 1 tablet (250 mg total) by mouth daily. Take along with 1000 mg daily - total of 1250 mg daily 90 tablet 0   escitalopram (LEXAPRO) 20 MG tablet Take 20 mg by mouth  daily.     Melatonin 10 MG TBCR Take by mouth.     divalproex (DEPAKOTE) 500 MG DR tablet Take 1 tablet (500 mg total) by mouth 2 (two) times daily. 180 tablet 0   lithium 300 MG tablet Take 1-2 tablets (300-600 mg total) by mouth as directed. Take 1 tablet daily AM and 2 tablets daily PM 90 tablet 0   methylphenidate (METADATE CD) 50 MG CR capsule Take 1 capsule (50 mg total) by mouth every morning. 30 capsule 0   methylphenidate (RITALIN) 10 MG tablet Take 1 tablet (10 mg total) by mouth every morning. 30 tablet 0   QUEtiapine (SEROQUEL) 25 MG tablet Take 1 tablet (25 mg total) by mouth at bedtime. 30 tablet 1   No current facility-administered medications for this visit.     Musculoskeletal: Strength & Muscle Tone: within normal limits Gait & Station: normal Patient leans: N/A  Psychiatric Specialty Exam: Review of Systems  Unable to perform ROS: Other (patient with ID)  All other systems reviewed and are negative.  Blood pressure (!) 143/88, pulse 71, temperature (!) 97.3 F (36.3 C), temperature source Temporal, weight 167 lb 9.6 oz (76 kg).Body mass index is 27.92 kg/m.  General Appearance: Casual  Eye Contact:  Fair  Speech:  Clear and Coherent  Volume:  Normal  Mood:  Angry, Anxious, and Irritable  Affect:  Congruent  Thought Process:  Goal Directed and Descriptions of Associations: Intact  Orientation:  Full (Time, Place, and Person)  Thought Content: Logical   Suicidal Thoughts:  No  Homicidal Thoughts:  No  Memory:  Immediate;   Fair Recent;   Fair Remote;   limited  Judgement:  Other:  limited  Insight:  Shallow  Psychomotor Activity:  Normal  Concentration:  Concentration: Poor and Attention Span: Poor  Recall:  [employer] of Knowledge:  limited  Language: Fair  Akathisia:  No  Handed:  Right  AIMS (if indicated): done,0  Assets:  Wellsite geologist  ADL's:  Intact  Cognition: baseline  Sleep:   improving    Screenings: Camera operator Row Office Visit from 03/22/2021 in [facility]  PHQ-2 Total Score 0        Assessment and Plan: Orvell Derenzo is a 26 year old Caucasian male, single, on disability, has a history of ADHD, episodic mood disorder, intellectual disability, lives in [place] with his parents, mother being his legal guardian, presented for follow-up medication management.  Patient continues to have mood lability, likely worsening since being off of the Geodon although sleep has improved on the Seroquel.  We will continue to make medication changes.  Plan as noted below.  Plan Episodic mood disorder-unstable Increase Depakote to 1250 mg p.o. daily divided dosage Continue Lexapro 20 mg p.o. daily Continue lithium 300 mg p.o. daily Continue Seroquel 25 mg p.o. nightly  ADHD-stable Continue Metadate CD 50 milligrams p.o. daily in the morning Ritalin 10 mg, advised to take if around noon daily. We will provide limited supply of this medication for now.  Patient will need neurology clearance due to history of seizures-has upcoming appointment scheduled.  We will continue his stimulants until his neurology visit.  We will consider making changes as needed. Reviewed [place] PMP aware  Mild intellectual disability-patient has been referred to our therapist. Patient will continue to need support.  At risk for prolonged QT syndrome-reviewed EKG dated 03/28/2021-normal sinus rhythm, QTC-413.  High risk medication use-reviewed and discussed labs-valproic acid-subtherapeutic at 35-dated 03/25/2021, sodium-137-within normal limits, BUN and creatinine-within normal limits, platelet count-within normal limits, TSH-3.040, prolactin-low at 3.2. Will order labs-lithium level, hepatic function-pending.  Encouraged to get it done. We will also order Depakote level-patient to go to lab 5 days after staying on the higher dosage.  Elevated blood pressure reading in  session today-patient on stimulants, will need to monitor his blood pressure closely.  We will continue to monitor his blood pressure, we will consider making medication changes as needed. We will also route this message to primary care provider to coordinate care.  Collateral information obtained from mother Santiago Glad as noted above.  Reviewed medical records from multiple providers as noted above-[facility],  neuropsychiatric as well as others.   Follow-up in clinic in 2 to 3 weeks or sooner if needed.    Collaboration of Care: Collaboration of Care: Primary Care Provider AEB for elevated blood pressure reading, Referral or follow-up with counselor/therapist AEB patient has upcoming appointment with therapist, and Other refer to neurology for history of seizures.  Patient/Guardian was advised Release of Information must be obtained prior to any record release in order to collaborate their care with an outside provider. Patient/Guardian was advised if they have not already done so to contact the registration department to sign all necessary forms in order for [place] to release information regarding their care.   Consent: Patient/Guardian gives verbal consent for treatment and assignment of benefits for services provided during this visit. Patient/Guardian expressed understanding and agreed to proceed.    This note was generated in part or whole with voice recognition software. Voice recognition is usually quite accurate but there are transcription errors that can and very often do occur. I apologize for any typographical errors that were not detected and corrected.      Ursula Alert, MD 04/08/2021, 1:26 PM

## 2021-04-08 ENCOUNTER — Encounter: Payer: Self-pay | Admitting: Psychiatry

## 2021-04-14 ENCOUNTER — Telehealth: Payer: Self-pay

## 2021-04-14 DIAGNOSIS — F902 Attention-deficit hyperactivity disorder, combined type: Secondary | ICD-10-CM

## 2021-04-14 MED ORDER — METHYLPHENIDATE HCL ER (CD) 50 MG PO CPCR: 50.0000 mg | ORAL_CAPSULE | Freq: Every morning | ORAL | 0 refills | Status: DC

## 2021-04-14 NOTE — Telephone Encounter (Signed)
pt left a message asking ify ou would send a rx for the meythlphenidate to [facility] pharmacy ?

## 2021-04-14 NOTE — Telephone Encounter (Signed)
Contacted mother to verify what prescription and what pharmacy since the message that was sent to me was not clear. ? ?According to mother Santiago Glad, [facility] is out of Metadate and hence she wants it sent to [facility] on [address]. ? ?We will send Metadate 50 mg to [facility]. ? ?I contacted [facility] pharmacy to cancel the prescription which was sent out to them previously. ? ? ?

## 2021-04-14 NOTE — Telephone Encounter (Signed)
I already send it to [facility] - why am I getting another message , please clarify. ?

## 2021-04-14 NOTE — Telephone Encounter (Signed)
pt called left a message that the pharmacy did not have the ritalin please send to [facility].  ?

## 2021-04-18 NOTE — Telephone Encounter (Signed)
Closed note. 

## 2021-04-20 ENCOUNTER — Other Ambulatory Visit: Payer: Self-pay | Admitting: Psychiatry

## 2021-04-21 LAB — HEPATIC FUNCTION PANEL
ALT: 16 IU/L (ref 0–44)
AST: 20 IU/L (ref 0–40)
Albumin: 4.9 g/dL (ref 4.1–5.2)
Alkaline Phosphatase: 68 IU/L (ref 44–121)
Bilirubin Total: 0.4 mg/dL (ref 0.0–1.2)
Bilirubin, Direct: 0.11 mg/dL (ref 0.00–0.40)
Total Protein: 7 g/dL (ref 6.0–8.5)

## 2021-04-21 LAB — VALPROIC ACID LEVEL: Valproic Acid Lvl: 89 ug/mL (ref 50–100)

## 2021-04-21 LAB — LITHIUM LEVEL: Lithium Lvl: 0.7 mmol/L (ref 0.5–1.2)

## 2021-04-26 ENCOUNTER — Other Ambulatory Visit: Payer: Self-pay

## 2021-04-26 ENCOUNTER — Ambulatory Visit (INDEPENDENT_AMBULATORY_CARE_PROVIDER_SITE_OTHER): Payer: Medicare Other | Admitting: Psychiatry

## 2021-04-26 ENCOUNTER — Encounter: Payer: Self-pay | Admitting: Psychiatry

## 2021-04-26 VITALS — BP 152/91 | HR 83 | Temp 97.9°F | Wt 169.2 lb

## 2021-04-26 DIAGNOSIS — F39 Unspecified mood [affective] disorder: Secondary | ICD-10-CM | POA: Diagnosis not present

## 2021-04-26 DIAGNOSIS — Z9189 Other specified personal risk factors, not elsewhere classified: Secondary | ICD-10-CM

## 2021-04-26 DIAGNOSIS — F70 Mild intellectual disabilities: Secondary | ICD-10-CM | POA: Diagnosis not present

## 2021-04-26 DIAGNOSIS — F902 Attention-deficit hyperactivity disorder, combined type: Secondary | ICD-10-CM | POA: Diagnosis not present

## 2021-04-26 MED ORDER — LITHIUM CARBONATE 300 MG PO TABS: 300.0000 mg | ORAL_TABLET | ORAL | 0 refills | Status: DC

## 2021-04-26 MED ORDER — METHYLPHENIDATE HCL ER (CD) 50 MG PO CPCR: 50.0000 mg | ORAL_CAPSULE | Freq: Every morning | ORAL | 0 refills | Status: DC

## 2021-04-26 MED ORDER — ESCITALOPRAM OXALATE 20 MG PO TABS: 20.0000 mg | ORAL_TABLET | Freq: Every day | ORAL | 0 refills | Status: DC

## 2021-04-26 MED ORDER — METHYLPHENIDATE HCL 10 MG PO TABS: 10.0000 mg | ORAL_TABLET | Freq: Every morning | ORAL | 0 refills | Status: DC

## 2021-04-26 NOTE — Patient Instructions (Signed)
[facility] ?[email or web address] ?[address], [place], [place] [address] ?  ?[phone] ? ?[facility] ?[address] ?[place], [place] [address] ? ?Phone number - [phone] ? ?[email or web address] ? ?Call DSS for current list. ? ?

## 2021-04-26 NOTE — Progress Notes (Signed)
BH MD OP Progress Note  04/26/2021 9:38 AM [name]  MRN:  [id number]  Chief Complaint:  Chief Complaint  Patient presents with   Follow-up : 26 year old Caucasian male with history of episodic mood disorder, ADHD, mild intellectual disabilities, presented for medication management.   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  Patient today appeared to be in pain.  Patient reports he has been having back pain since the past couple of days, worse today.  He has difficulty changing positions due to the pain.  He reports he is currently going to his primary provider for an evaluation.  He does not know what could have triggered this.  Patient otherwise appeared to be calm and cooperative.  Patient reports he has been compliant with medications.  Reports the medications as helpful.  He does have some anxiety on and off, reports he gets clumsy and drops things at work.  He reports that however does not happen often, maybe once a week or so.    Patient is interested in psychotherapy, agrees to schedule an appointment with therapist.  Denies suicidality, homicidality or perceptual disturbances.  Reports sleep is good.  Collateral information obtained from mother-[name] -who reports patient has shown some improvement on the current medication regimen.  He does get irritable when he returns home from work.  He does sleep through the night on the Seroquel.  Mother continues to be concerned about behavioral problems, sexually inappropriate behaviors, verbal aggression on and off.  According to mother, his sister moved out of the home because of his sexually inappropriate behaviors and she is afraid to have the grandchildren  around when he is home.  Mother is interested in group home placement.  Reports he is currently on a waiting list.    Visit Diagnosis:    ICD-10-CM   1. Episodic mood disorder (HCC)  F39 lithium 300 MG tablet    escitalopram (LEXAPRO) 20 MG tablet    2. Attention deficit hyperactivity disorder (ADHD), combined type  F90.2 methylphenidate (RITALIN) 10 MG tablet    methylphenidate (METADATE CD) 50 MG CR capsule    3. Mild intellectual disability  F70     4. At risk for prolonged QT interval syndrome  Z91.89       Past Psychiatric History: Reviewed past psychiatric history from my progress note on 03/22/2021.  Also reviewed progress note from 04/07/2021-review of all his past medical records.  Patient had neuropsychological testing done-dated 07/28/2016, October 02, 2016 [facility]) -patient was diagnosed with mild intellectual disability, ADD, mood disorder.    Past Medical History: Patient has a history of generalized tonic-clonic seizures. Past Medical History:  Diagnosis Date   Seizures (HCC)    last seizure was at the age of 5   History reviewed. No pertinent surgical history.  Family Psychiatric History: Reviewed family psychiatric history from progress note on 03/22/2021.  Family History:  Family History  Problem Relation Age of Onset   Mental illness Neg Hx     Social History: Reviewed social history from progress note on 03/22/2021. Social History   Socioeconomic History   Marital status: Single    Spouse name: Not on file   Number of children: Not on file   Years of education: Not  on file   Highest education level: Not on file  Occupational History   Not on file  Tobacco Use   Smoking status: Never    Passive exposure: Never   Smokeless tobacco: Not on file  Vaping Use   Vaping Use: Never used  Substance and Sexual Activity   Alcohol use: No   Drug use: Never   Sexual activity: Not Currently  Other Topics Concern   Not on file  Social History  Narrative   PATIENT HAS LEGAL GUARDIAN - [name],[name] and [name] ( parents )    Social Determinants of Corporate investment banker Strain: Not on file  Food Insecurity: Not on file  Transportation Needs: Not on file  Physical Activity: Not on file  Stress: Not on file  Social Connections: Not on file    Allergies: No Known Allergies  Metabolic Disorder Labs: No results found for: HGBA1C, MPG Lab Results  Component Value Date   PROLACTIN 3.2 (L) 03/25/2021   No results found for: CHOL, TRIG, HDL, CHOLHDL, VLDL, LDLCALC Lab Results  Component Value Date   TSH 3.040 03/25/2021    Therapeutic Level Labs: Lab Results  Component Value Date   LITHIUM 0.7 04/20/2021   Lab Results  Component Value Date   VALPROATE 89 04/20/2021   VALPROATE 35 (L) 03/25/2021   No components found for:  CBMZ  Current Medications: Current Outpatient Medications  Medication Sig Dispense Refill   divalproex (DEPAKOTE) 250 MG DR tablet Take 1 tablet (250 mg total) by mouth daily. Take along with 1000 mg daily - total of 1250 mg daily 90 tablet 0   divalproex (DEPAKOTE) 500 MG DR tablet Take 1 tablet (500 mg total) by mouth 2 (two) times daily. 180 tablet 0   Melatonin 10 MG TBCR Take by mouth.     QUEtiapine (SEROQUEL) 25 MG tablet Take 1 tablet (25 mg total) by mouth at bedtime. 30 tablet 1   escitalopram (LEXAPRO) 20 MG tablet Take 1 tablet (20 mg total) by mouth daily. 90 tablet 0   lithium 300 MG tablet Take 1-2 tablets (300-600 mg total) by mouth as directed. Take 1 tablet daily AM and 2 tablets daily PM 270 tablet 0   [START ON 05/13/2021] methylphenidate (METADATE CD) 50 MG CR capsule Take 1 capsule (50 mg total) by mouth every morning. 30 capsule 0   [START ON 05/07/2021] methylphenidate (RITALIN) 10 MG tablet Take 1 tablet (10 mg total) by mouth every morning. 30 tablet 0   No current facility-administered medications for this visit.     Musculoskeletal: Strength & Muscle Tone: within  normal limits Gait & Station:  slow due to back pain Patient leans: N/A  Psychiatric Specialty Exam: Review of Systems  Musculoskeletal:  Positive for back pain.  Psychiatric/Behavioral:  The patient is nervous/anxious.   All other systems reviewed and are negative.  Blood pressure (!) 152/91, pulse 83, temperature 97.9 F (36.6 C), temperature source Temporal, weight 169 lb 3.2 oz (76.7 kg).Body mass index is 28.19 kg/m.  General Appearance: Casual  Eye Contact:  Fair  Speech:  Clear and Coherent  Volume:  Normal  Mood:  Anxious  Affect:  Full Range  Thought Process:  Goal Directed and Descriptions of Associations: Intact  Orientation:  Full (Time, Place, and Person)  Thought Content: Logical   Suicidal Thoughts:  No  Homicidal Thoughts:  No  Memory:  Immediate;   Fair Recent;   Fair Remote;   Limited  Judgement:  Fair  Insight:  Shallow  Psychomotor Activity:  Normal  Concentration:  Concentration: Fair and Attention Span: Fair  Recall:  [employer] of Knowledge: Fair  Language: Fair  Akathisia:  No  Handed:  Right  AIMS (if indicated): done  Assets:  Communication Skills Desire for Improvement Housing Social Support Transportation  ADL's:  Intact  Cognition: baseline  Sleep:  Fair   Screenings: AIMS    Flowsheet Row Office Visit from 04/26/2021 in [facility]  AIMS Total Score 0      PHQ2-9    Flowsheet Row Office Visit from 03/22/2021 in [facility]  PHQ-2 Total Score 0        Assessment and Plan: Aristides Gross is a 26 year old Caucasian male, single, on disability, has a history of ADHD, episodic mood disorder, intellectual disability, lives in [place] with his parents ( legal guardians), presented for medication management.  Patient continues to have behavioral problems although improving.  Will benefit from the following plan.  Plan Episodic mood disorder-improving Depakote 1250 mg p.o.  daily divided dosage Lexapro 20 mg p.o. daily Lithium 300 mg p.o. daily Seroquel 25 mg p.o. nightly  ADHD-stable Metadate CD 50 milligram p.o. daily in the morning Ritalin 10 mg p.o. daily in the afternoon.  Mild intellectual disability-chronic Referred for CBT  High risk medication use-reviewed and discussed labs dated 04/20/2021-lithium level 0.7-therapeutic Depakote level-89-therapeutic Hepatic function panel-within normal limits.  Collateral information obtained from Ms. Webb Silversmith guardian as well as parent as noted above.  Provided community resources for group home placement.  Patient advised to follow up with primary care provider for back pain.  Follow-up in clinic in 6 to 8 weeks or sooner if needed.  Collaboration of Care: Collaboration of Care: Primary Care Provider AEB encouraged to follow up with primary care provider for back pain and Referral or follow-up with counselor/therapist AEB and encouraged to follow up with therapist, communicated with staff.  Patient/Guardian was advised Release of Information must be obtained prior to any record release in order to collaborate their care with an outside provider. Patient/Guardian was advised if they have not already done so to contact the registration department to sign all necessary forms in order for [place] to release information regarding their care.   Consent: Patient/Guardian gives verbal consent for treatment and assignment of benefits for services provided during this visit. Patient/Guardian expressed understanding and agreed to proceed.   This note was generated in part or whole with voice recognition software. Voice recognition is usually quite accurate but there are transcription errors that can and very often do occur. I apologize for any typographical errors that were not detected and corrected.  Jomarie Longs, MD 04/27/2021, 8:21 AM

## 2021-04-27 ENCOUNTER — Other Ambulatory Visit: Payer: Self-pay | Admitting: Psychiatry

## 2021-04-27 DIAGNOSIS — F39 Unspecified mood [affective] disorder: Secondary | ICD-10-CM

## 2021-04-28 NOTE — Telephone Encounter (Signed)
I have changed the lithium to capsule as per request from the pharmacy. ?

## 2021-04-29 ENCOUNTER — Ambulatory Visit: Payer: Medicare Other | Admitting: Psychiatry

## 2021-05-10 ENCOUNTER — Other Ambulatory Visit: Payer: Self-pay | Admitting: Psychiatry

## 2021-05-10 ENCOUNTER — Telehealth: Payer: Self-pay

## 2021-05-10 MED ORDER — METHYLPHENIDATE HCL ER (CD) 50 MG PO CPCR: 50.0000 mg | ORAL_CAPSULE | ORAL | 0 refills | Status: DC

## 2021-05-10 NOTE — Telephone Encounter (Signed)
[facility] not [facility]. please send the metadate cd 50mg  to [facility] [address]. ?

## 2021-05-10 NOTE — Telephone Encounter (Signed)
Ordered to be started on 4/9. Will defer another refill to Dr. Shea Evans after she is back.  ? ?I have utilized the [place] Controlled Substances Reporting System (PMP AWARxE) to confirm adherence regarding the patient's medication. My review reveals appropriate prescription fills.

## 2021-05-19 NOTE — Telephone Encounter (Signed)
Noted . Will await patient call back for next refill, could send to preferred pharmacy at that time. ?

## 2021-05-23 ENCOUNTER — Ambulatory Visit: Payer: Medicare Other | Admitting: Psychiatry

## 2021-06-08 ENCOUNTER — Telehealth: Payer: Self-pay

## 2021-06-08 DIAGNOSIS — F902 Attention-deficit hyperactivity disorder, combined type: Secondary | ICD-10-CM

## 2021-06-08 MED ORDER — METHYLPHENIDATE HCL ER (CD) 50 MG PO CPCR: 50.0000 mg | ORAL_CAPSULE | Freq: Every morning | ORAL | 0 refills | Status: DC

## 2021-06-08 MED ORDER — METHYLPHENIDATE HCL 10 MG PO TABS: 10.0000 mg | ORAL_TABLET | Freq: Every morning | ORAL | 0 refills | Status: DC

## 2021-06-08 NOTE — Telephone Encounter (Signed)
[name] called left message that pt methylphenidate 10mg  needs to be sent to the [facility] in [place] and the methylphenidate 50mg  [facility] [address].  ?

## 2021-06-08 NOTE — Telephone Encounter (Signed)
I have sent methylphenidate 10 mg to [facility] [place], methylphenidate 50 mg to [facility]. ?

## 2021-06-14 ENCOUNTER — Telehealth: Payer: Self-pay

## 2021-06-14 DIAGNOSIS — F39 Unspecified mood [affective] disorder: Secondary | ICD-10-CM

## 2021-06-14 DIAGNOSIS — Z79899 Other long term (current) drug therapy: Secondary | ICD-10-CM

## 2021-06-14 MED ORDER — DIVALPROEX SODIUM 250 MG PO DR TAB: 250.0000 mg | DELAYED_RELEASE_TABLET | Freq: Every day | ORAL | 0 refills | Status: DC

## 2021-06-14 MED ORDER — DIVALPROEX SODIUM 500 MG PO DR TAB: 500.0000 mg | DELAYED_RELEASE_TABLET | Freq: Two times a day (BID) | ORAL | 0 refills | Status: DC

## 2021-06-14 NOTE — Telephone Encounter (Signed)
they have changed pharmacy and need the depakote 250mg  sent to the [facility].  ?

## 2021-06-14 NOTE — Telephone Encounter (Signed)
I have sent Depakote refills to [facility]. ?

## 2021-06-15 ENCOUNTER — Ambulatory Visit (INDEPENDENT_AMBULATORY_CARE_PROVIDER_SITE_OTHER): Payer: Medicare Other | Admitting: Licensed Clinical Social Worker

## 2021-06-15 DIAGNOSIS — F39 Unspecified mood [affective] disorder: Secondary | ICD-10-CM | POA: Diagnosis not present

## 2021-06-15 NOTE — Progress Notes (Signed)
Comprehensive Clinical Assessment (CCA) Note ? ?06/15/2021 ?Allena EaringDavid Chandler ?[id number] ? ?[facility] in office visit for patient, pt mother Clydie BraunKaren, and LCSW clinician ? ?Chief Complaint:  ?Chief Complaint  ?Patient presents with  ? Establish Care  ? ?Visit Diagnosis:  ?Encounter Diagnosis  ?Name Primary?  ? Episodic mood disorder (HCC) Yes  ? ?  ? ? ?CCA Screening, Triage and Referral (STR) ? ?Patient Reported Information ?How did you hear about us? No data recorded ?Referral name: Dr. Jomarie LongsSaramma Eappen ? ?Referral phone number: No data recorded ? ?Whom do you see for routine medical problems? No data recorded ?Practice/Facility Name: No data recorded ?Practice/Facility Phone Number: No data recorded ?Name of Contact: No data recorded ?Contact Number: No data recorded ?Contact Fax Number: No data recorded ?Prescriber Name: No data recorded ?Prescriber Address (if known): No data recorded ? ?What Is the Reason for Your Visit/Call Today? No data recorded ?How Long Has This Been Causing You Problems? > than 6 months ? ?What Do You Feel Would Help You the Most Today? Treatment for Depression or other mood problem ? ? ?Have You Recently Been in Any Inpatient Treatment (Hospital/Detox/Crisis Center/28-Day Program)? No ? ?Name/Location of Program/Hospital:No data recorded ?How Long Were You There? No data recorded ?When Were You Discharged? No data recorded ? ?Have You Ever Received Services From [facility] Before? Yes ? ?Who Do You See at [facility]? No data recorded ? ?Have You Recently Had Any Thoughts About Hurting Yourself? No ? ?Are You Planning to Commit Suicide/Harm Yourself At This time? No ? ? ?Have you Recently Had Thoughts About Hurting Someone Karolee Ohslse? No ? ?Explanation: No data recorded ? ?Have You Used Any Alcohol or Drugs in the Past 24 Hours? No ? ?How Long Ago Did You Use Drugs or Alcohol? No data recorded ?What Did You Use and How Much? No data recorded ? ?Do You Currently Have a Therapist/Psychiatrist? Yes ? ?Name  of Therapist/Psychiatrist: Dr. Jomarie LongsSaramma Eappen ? ? ?Have You Been Recently Discharged From Any Office Practice or Programs? No ? ?Explanation of Discharge From Practice/Program: No data recorded ? ?  ?CCA Screening Triage Referral Assessment ?Type of Contact: Face-to-Face ? ?Is this Initial or Reassessment? No data recorded ?Date Telepsych consult ordered in [facility]:  No data recorded ?Time Telepsych consult ordered in [facility]L:  No data recorded ? ?Patient Reported Information Reviewed? No data recorded ?Patient Left Without Being Seen? No data recorded ?Reason for Not Completing Assessment: No data recorded ? ?Collateral Involvement: Pt is accompanied by his mother, Clydie BraunKaren, who is primary historian ? ? ?Does Patient Have a Automotive engineerCourt Appointed Legal Guardian? No data recorded ?Name and Contact of Legal Guardian: No data recorded ?If Minor and Not Living with Parent(s), Who has Custody? No data recorded ?Is CPS involved or ever been involved? Never ? ?Is APS involved or ever been involved? Currently (Pt mother trying to get pt placed in group home) ? ? ?Patient Determined To Be At Risk for Harm To Self or Others Based on Review of Patient Reported Information or Presenting Complaint? No (Further investigation needed due to history of sexual inappropriate behavior with others) ? ?Method: No data recorded ?Availability of Means: No data recorded ?Intent: No data recorded ?Notification Required: No data recorded ?Additional Information for Danger to Others Potential: No data recorded ?Additional Comments for Danger to Others Potential: No data recorded ?Are There Guns or Other Weapons in Your Home? No data recorded ?Types of Guns/Weapons: No data recorded ?Are These Weapons Safely Secured?  No data recorded ?Who Could Verify You Are Able To Have These Secured: No data recorded ?Do You Have any Outstanding Charges, Pending Court Dates, Parole/Probation? No data recorded ?Contacted To Inform of Risk of Harm To  Self or Others: No data recorded ? ?Location of Assessment: No data recorded ? ?Does Patient Present under Involuntary Commitment? No ? ?IVC Papers Initial File Date: No data recorded ? ?[place] of Residence: [place] ? ? ?Patient Currently Receiving the Following Services: Medication Management ? ? ?Determination of Need: Routine (7 days) ? ? ?Options For Referral: Medication Management; Outpatient Therapy ? ? ? ? ?CCA Biopsychosocial ?Intake/Chief Complaint:  No data recorded ?Current Symptoms/Problems: No data recorded ? ?Patient Reported Schizophrenia/Schizoaffective Diagnosis in Past: No ? ? ?Strengths: No data recorded ?Preferences: No data recorded ?Abilities: No data recorded ? ?Type of Services Patient Feels are Needed: No data recorded ? ?Initial Clinical Notes/Concerns: No data recorded ? ?Mental Health Symptoms ?Depression:   ?Irritability ?  ?Duration of Depressive symptoms: No data recorded  ?Mania:   ?None ?  ?Anxiety:    ?Worrying; Restlessness; Irritability ?  ?Psychosis:   ?None ?  ?Duration of Psychotic symptoms: No data recorded  ?Trauma:   ?None ?  ?Obsessions:   ?N/A ?  ?Compulsions:   ?N/A ?  ?Inattention:   ?Poor follow-through on tasks ?  ?Hyperactivity/Impulsivity:   ?Blurts out answers; Fidgets with hands/feet; Runs and climbs; Always on the go; Feeling of restlessness; Talks excessively; Several symptoms present in 2 of more settings; Symptoms present before age 20 ?  ?Oppositional/Defiant Behaviors:   ?Argumentative ?  ?Emotional Irregularity:   ?Mood lability ?  ?Other Mood/Personality Symptoms:  No data recorded  ? ?Mental Status Exam ?Appearance and self-care  ?Stature:   ?Average ?  ?Weight:   ?Average weight ?  ?Clothing:   ?Neat/clean ?  ?Grooming:   ?Normal ?  ?Cosmetic use:   ?None ?  ?Posture/gait:   ?Normal ?  ?Motor activity:   ?Restless ?  ?Sensorium  ?Attention:   ?Distractible ?  ?Concentration:   ?Focuses on irrelevancies ?  ?Orientation:   ?X5 ?  ?Recall/memory:    ?Defective in Immediate ?  ?Affect and Mood  ?Affect:   ?Anxious ?  ?Mood:   ?Irritable (with mother) ?  ?Relating  ?Eye contact:   ?Fleeting ?  ?Facial expression:   ?Anxious ?  ?Attitude toward examiner:   ?Cooperative ?  ?Thought and Language  ?Speech flow:  ?Clear and Coherent ?  ?Thought content:   ?Appropriate to Mood and Circumstances ?  ?Preoccupation:   ?None ?  ?Hallucinations:   ?None ?  ?Organization:  No data recorded  ?Executive Functions  ?Fund of Knowledge:   ?Good ?  ?Intelligence:   ?Below average (Per chart review) ?  ?Abstraction:   ?Concrete ?  ?Judgement:   ?Impaired ?  ?Reality Testing:   ?Variable ?  ?Insight:   ?Gaps ?  ?Decision Making:   ?Impulsive ?  ?Social Functioning  ?Social Maturity:   ?Impulsive ?  ?Social Judgement:   ?Heedless; Impropriety ?  ?Stress  ?Stressors:   ?Family conflict; Work ?  ?Coping Ability:   ?Overwhelmed ?  ?Skill Deficits:   ?Self-control; Responsibility; Interpersonal ?  ?Supports:   ?Family ?  ? ? ?Religion: ?  ? ?Leisure/Recreation: ?Leisure / Recreation ?Do You Have Hobbies?: Yes ?Leisure and Hobbies: spend time with a neighbor ? ?Exercise/Diet: ?Exercise/Diet ?Do You Have Any Trouble Sleeping?: No ? ? ?CCA Employment/Education ?Employment/Work Situation: ?Employment /  Work Situation ?Employment Situation: Employed ?Where is Patient Currently Employed?: [employer] ?How Long has Patient Been Employed?: several years ?Are You Satisfied With Your Job?: Yes ?Do You Work More Than One Job?: Yes ?Work Stressors: engaging with the public and doing a good job ?Has Patient ever Been in the Military?: No ? ?Education: ?Education ?Is Patient Currently Attending School?: No ?Did You Attend College?: No ? ? ?CCA Family/Childhood History ?Family and Relationship History: ?Family history ?Marital status: Single ? ?Childhood History:  ?Childhood History ?By whom was/is the patient raised?: Both parents ?Additional childhood history information: Pt currently residing  with both parents. Pt mother reports that she is trying to get pt placed in group home ?Does patient have siblings?: Yes ?Description of patient's current relationship with siblings: older brother and sister, y

## 2021-06-15 NOTE — Plan of Care (Signed)
Developed tx plan based on pt self reported input with both pt and pts parent ?

## 2021-06-20 ENCOUNTER — Other Ambulatory Visit: Payer: Self-pay | Admitting: Psychiatry

## 2021-06-20 DIAGNOSIS — F39 Unspecified mood [affective] disorder: Secondary | ICD-10-CM

## 2021-06-28 ENCOUNTER — Encounter: Payer: Self-pay | Admitting: Psychiatry

## 2021-06-28 ENCOUNTER — Telehealth (INDEPENDENT_AMBULATORY_CARE_PROVIDER_SITE_OTHER): Payer: Medicare Other | Admitting: Psychiatry

## 2021-06-28 DIAGNOSIS — F39 Unspecified mood [affective] disorder: Secondary | ICD-10-CM

## 2021-06-28 DIAGNOSIS — F902 Attention-deficit hyperactivity disorder, combined type: Secondary | ICD-10-CM

## 2021-06-28 DIAGNOSIS — F70 Mild intellectual disabilities: Secondary | ICD-10-CM | POA: Diagnosis not present

## 2021-06-28 MED ORDER — QUETIAPINE FUMARATE 50 MG PO TABS: 50.0000 mg | ORAL_TABLET | Freq: Every day | ORAL | 0 refills | Status: DC

## 2021-06-28 MED ORDER — ESCITALOPRAM OXALATE 20 MG PO TABS: 20.0000 mg | ORAL_TABLET | Freq: Every day | ORAL | 0 refills | Status: DC

## 2021-06-28 MED ORDER — METHYLPHENIDATE HCL ER (CD) 50 MG PO CPCR: 50.0000 mg | ORAL_CAPSULE | Freq: Every morning | ORAL | 0 refills | Status: DC

## 2021-06-28 MED ORDER — METHYLPHENIDATE HCL 10 MG PO TABS: 10.0000 mg | ORAL_TABLET | Freq: Every morning | ORAL | 0 refills | Status: DC
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
## 2021-07-07 ENCOUNTER — Other Ambulatory Visit: Payer: Self-pay | Admitting: Psychiatry

## 2021-07-07 DIAGNOSIS — F902 Attention-deficit hyperactivity disorder, combined type: Secondary | ICD-10-CM

## 2021-07-08 ENCOUNTER — Telehealth: Payer: Self-pay

## 2021-07-08 NOTE — Telephone Encounter (Signed)
Contacted mother-to discuss and clarify what she wanted.  Discussed that methylphenidate 50 mg was already sent out to the pharmacy with a due date on it several weeks ago.  Mother agrees to pick it up.

## 2021-07-08 NOTE — Telephone Encounter (Signed)
please send the [facility] the [facility] [address]

## 2021-07-26 ENCOUNTER — Encounter: Payer: Self-pay | Admitting: Psychiatry

## 2021-07-26 ENCOUNTER — Telehealth (INDEPENDENT_AMBULATORY_CARE_PROVIDER_SITE_OTHER): Payer: Medicare Other | Admitting: Psychiatry

## 2021-07-26 DIAGNOSIS — Z79899 Other long term (current) drug therapy: Secondary | ICD-10-CM

## 2021-07-26 DIAGNOSIS — F902 Attention-deficit hyperactivity disorder, combined type: Secondary | ICD-10-CM

## 2021-07-26 DIAGNOSIS — F39 Unspecified mood [affective] disorder: Secondary | ICD-10-CM

## 2021-07-26 DIAGNOSIS — F70 Mild intellectual disabilities: Secondary | ICD-10-CM

## 2021-07-26 MED ORDER — METHYLPHENIDATE HCL 10 MG PO TABS: 10.0000 mg | ORAL_TABLET | Freq: Every morning | ORAL | 0 refills | Status: DC

## 2021-07-26 MED ORDER — TRAZODONE HCL 50 MG PO TABS: 50.0000 mg | ORAL_TABLET | Freq: Every evening | ORAL | 0 refills | Status: DC | PRN

## 2021-07-26 MED ORDER — QUETIAPINE FUMARATE 50 MG PO TABS: 50.0000 mg | ORAL_TABLET | Freq: Every day | ORAL | 0 refills | Status: DC

## 2021-07-26 MED ORDER — METHYLPHENIDATE HCL ER (CD) 50 MG PO CPCR: 50.0000 mg | ORAL_CAPSULE | Freq: Every morning | ORAL | 0 refills | Status: DC

## 2021-07-26 MED ORDER — METHYLPHENIDATE HCL ER (CD) 10 MG PO CPCR: 10.0000 mg | ORAL_CAPSULE | ORAL | 0 refills | Status: DC

## 2021-07-26 NOTE — Progress Notes (Unsigned)
Virtual Visit via Video Note  I connected with [name] on 07/26/21 at  9:00 AM EDT by a video enabled telemedicine application and verified that I am speaking with the correct person using two identifiers.  Location Provider Location : [facility] Patient Location : Home  Participants: Patient , Mother ,Provider   I discussed the limitations of evaluation and management by telemedicine and the availability of in person appointments. The patient expressed understanding and agreed to proceed.   I discussed the assessment and treatment plan with the patient. The patient was provided an opportunity to ask questions and all were answered. The patient agreed with the plan and demonstrated an understanding of the instructions.   The patient was advised to call back or seek an in-person evaluation if the symptoms worsen or if the condition fails to improve as anticipated. [facility] MD OP Progress Note  07/26/2021 2:36 PM [name]  MRN:  [id number]  Chief Complaint:  Chief Complaint  Patient presents with   Follow-up: 26 year old Caucasian male with history of episodic mood disorder, ADHD, intellectual disability, presented for medication management.   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  Patient as well as mother-[name]-legal guardian participated in the evaluation today.  Patient reports having restlessness, anxiety especially around the time of his bedtime.  Patient reports he has been having trouble winding down at the end of the day several times a week.  He has been trying to listen to music.  The Seroquel does not help every night.  Denies side effects to his Seroquel.  Reports he is compliant on his medications.  May have missed the  Ritalin 10 mg some days.  Patient reports he has been working with a therapist-therapy sessions are beneficial.  Increased to discuss his anxiety with his therapist.  Patient also reports situational stressors at his workplace.  So far he has been handling it okay.  Patient denies any suicidality, homicidality or perceptual disturbances.  Per collateral information obtained from mother-patient does get upset easily for simple things.  Patient also has been having sleep problems.  Mother agrees to discuss his anger issues with his therapist, he has upcoming appointment.  Denies any other concerns today.  Mother reports she is planning to take a vacation in July.  At that time patient will have support from a friend who will come in and help-[name].  Visit Diagnosis:    ICD-10-CM   1. Episodic mood disorder (HCC)  F39 Lithium level    TSH    BUN+Creat    QUEtiapine (SEROQUEL) 50 MG tablet    traZODone (DESYREL) 50 MG tablet    2. Attention deficit hyperactivity disorder (ADHD), combined type  F90.2 methylphenidate (RITALIN) 10 MG tablet    methylphenidate (METADATE CD) 50 MG CR capsule    methylphenidate (METADATE CD) 10 MG CR capsule    3. Mild intellectual disability  F70     4. High risk medication use  Z79.899 Lithium level    TSH    BUN+Creat      Past Psychiatric History: Reviewed past psychiatric history from progress note on 03/22/2021.  Also reviewed progress note from 04/07/2021.  Past Medical History:  Past Medical History:  Diagnosis Date   Seizures ([place])    last seizure was at  the age of 5   History reviewed. No pertinent surgical history.  Family Psychiatric History: Reviewed family psychiatric history from progress note on 03/22/2021.  Family History:  Family History  Problem Relation Age of Onset   Mental illness Neg Hx     Social History: Reviewed social history from progress note on 03/22/2021. Social History   Socioeconomic History   Marital status:  Single    Spouse name: Not on file   Number of children: Not on file   Years of education: Not on file   Highest education level: Not on file  Occupational History   Not on file  Tobacco Use   Smoking status: Never    Passive exposure: Never   Smokeless tobacco: Not on file  Vaping Use   Vaping Use: Never used  Substance and Sexual Activity   Alcohol use: No   Drug use: Never   Sexual activity: Not Currently  Other Topics Concern   Not on file  Social History Narrative   PATIENT HAS LEGAL GUARDIAN - [name],[name] and [name] ( parents )    Social Determinants of Health   Financial Resource Strain: Not on file  Food Insecurity: Not on file  Transportation Needs: Not on file  Physical Activity: Not on file  Stress: Not on file  Social Connections: Not on file    Allergies: No Known Allergies  Metabolic Disorder Labs: No results found for: "HGBA1C", "MPG" Lab Results  Component Value Date   PROLACTIN 3.2 (L) 03/25/2021   No results found for: "CHOL", "TRIG", "HDL", "CHOLHDL", "VLDL", "LDLCALC" Lab Results  Component Value Date   TSH 3.040 03/25/2021    Therapeutic Level Labs: Lab Results  Component Value Date   LITHIUM 0.7 04/20/2021   Lab Results  Component Value Date   VALPROATE 89 04/20/2021   VALPROATE 35 (L) 03/25/2021   No results found for: "CBMZ"  Current Medications: Current Outpatient Medications  Medication Sig Dispense Refill   divalproex (DEPAKOTE) 250 MG DR tablet Take 1 tablet (250 mg total) by mouth daily. Take along with 1000 mg daily - total of 1250 mg daily 90 tablet 0   divalproex (DEPAKOTE) 500 MG DR tablet Take 1 tablet (500 mg total) by mouth 2 (two) times daily. 180 tablet 0   escitalopram (LEXAPRO) 20 MG tablet Take 1 tablet (20 mg total) by mouth daily. 90 tablet 0   lithium carbonate 300 MG capsule Take 1-2 capsules (300-600 mg total) by mouth as directed. Take 1 tablet daily AM and 2 tablets daily PM 270 capsule 1   [START ON  09/01/2021] methylphenidate (METADATE CD) 10 MG CR capsule Take 1 capsule (10 mg total) by mouth every morning. 30 capsule 0   traZODone (DESYREL) 50 MG tablet Take 1 tablet (50 mg total) by mouth at bedtime as needed for sleep. 90 tablet 0   methylphenidate (METADATE CD) 50 MG CR capsule Take 1 capsule (50 mg total) by mouth every morning. 30 capsule 0   [START ON 08/09/2021] methylphenidate (METADATE CD) 50 MG CR capsule Take 1 capsule (50 mg total) by mouth every morning. 30 capsule 0   [START ON 08/04/2021] methylphenidate (RITALIN) 10 MG tablet Take 1 tablet (10 mg total) by mouth every morning. 30 tablet 0   QUEtiapine (SEROQUEL) 50 MG tablet Take 1 tablet (50 mg total) by mouth at bedtime. 90 tablet 0   No current facility-administered medications for this visit.     Musculoskeletal: Strength & Muscle Tone:  UTA  Gait & Station:  Seated Patient leans: N/A  Psychiatric Specialty Exam: Review of Systems  Psychiatric/Behavioral:  Positive for sleep disturbance. The patient is nervous/anxious.   All other systems reviewed and are negative.   There were no vitals taken for this visit.There is no height or weight on file to calculate BMI.  General Appearance: Casual  Eye Contact:  Fair  Speech:  Normal Rate  Volume:  Normal  Mood:  Anxious  Affect:  Congruent  Thought Process:  Goal Directed and Descriptions of Associations: Intact  Orientation:  Full (Time, Place, and Person)  Thought Content: Logical   Suicidal Thoughts:  No  Homicidal Thoughts:  No  Memory:  Immediate;   Fair Recent;   Fair Remote;   Limited  Judgement:  Fair  Insight:  Shallow  Psychomotor Activity:  Normal  Concentration:  Concentration: Fair and Attention Span: Fair  Recall:  [employer] of Knowledge: Fair  Language: Fair  Akathisia:  No  Handed:  Right  AIMS (if indicated): done  Assets:  Communication Skills Desire for Improvement Housing Social Support Talents/Skills  ADL's:  Intact  Cognition:  baseline  Sleep:  Poor   Screenings: AIMS    Flowsheet Row Video Visit from 07/26/2021 in [facility] Video Visit from 06/28/2021 in [facility] Office Visit from 04/26/2021 in [facility]  AIMS Total Score 0 0 0      GAD-7    Flowsheet Row Video Visit from 07/26/2021 in [facility]  Total GAD-7 Score 6      PHQ2-9    Flowsheet Row Video Visit from 07/26/2021 in [facility] Counselor from 06/15/2021 in [facility] Office Visit from 03/22/2021 in [facility]  PHQ-2 Total Score 0 0 0      Flowsheet Row Video Visit from 07/26/2021 in [facility] Counselor from 06/15/2021 in [facility]  C-SSRS RISK CATEGORY No Risk No Risk        Assessment and Plan: Tysen Roesler is a 26 year old Caucasian male, single, on disability, has a history of ADHD, episodic mood disorder, intellectual disability, lives in [place] with his parents (legal guardian) , continues to have mood lability, behavioral problems, sleep problems.  Will benefit from the following plan.  Plan Episodic mood disorder-improving Depakote 1250 mg p.o. daily in divided dosage Lexapro 20 mg p.o. daily Lithium 300 mg p.o. daily Seroquel 50 mg p.o. nightly Start trazodone 50 mg p.o. nightly as needed for sleep. Lithium level-04/20/2021-0.7-therapeutic Valproic acid level-89-therapeutic-04/20/2021.   ADHD-stable Metadate CD50 milligram p.o. daily Ritalin 10 mg p.o. daily in the afternoon Reviewed and CPM as needed  Mild intellectual disability-chronic Continue CBT  Patient has been referred to a neurologist due to history of seizures-has upcoming appointment.  We will coordinate care.  Collateral information obtained from mother who was present in session today.   Patient to work with his therapist on his anger management, behavioral problems.  Previously discussed referral for IOP, partial hospitalization program.  Follow-up in clinic in 3 to 4 weeks or sooner if needed.    Collaboration of Care: Collaboration of Care: Referral or follow-up with counselor/therapist AEB encouraged to continue to follow up with therapist.  Patient/Guardian was advised Release of Information must be obtained prior to any record release in order to collaborate their care with an outside provider. Patient/Guardian was advised if they have not already done so to contact the registration  department to sign all necessary forms in order for [place] to release information regarding their care.   Consent: Patient/Guardian gives verbal consent for treatment and assignment of benefits for services provided during this visit. Patient/Guardian expressed understanding and agreed to proceed.  This note was generated in part or whole with voice recognition software. Voice recognition is usually quite accurate but there are transcription errors that can and very often do occur. I apologize for any typographical errors that were not detected and corrected.       Ursula Alert, MD 07/26/2021, 2:36 PM

## 2021-08-01 ENCOUNTER — Ambulatory Visit (INDEPENDENT_AMBULATORY_CARE_PROVIDER_SITE_OTHER): Payer: Medicare Other | Admitting: Licensed Clinical Social Worker

## 2021-08-01 DIAGNOSIS — F39 Unspecified mood [affective] disorder: Secondary | ICD-10-CM | POA: Diagnosis not present

## 2021-08-03 LAB — BUN+CREAT
BUN/Creatinine Ratio: 16 (ref 9–20)
BUN: 14 mg/dL (ref 6–20)
Creatinine, Ser: 0.89 mg/dL (ref 0.76–1.27)
eGFR: 120 mL/min/{1.73_m2} (ref >59)

## 2021-08-03 LAB — TSH: TSH: 4.12 u[IU]/mL (ref 0.450–4.500)

## 2021-09-07 ENCOUNTER — Telehealth: Payer: Self-pay

## 2021-09-07 DIAGNOSIS — F902 Attention-deficit hyperactivity disorder, combined type: Secondary | ICD-10-CM

## 2021-09-07 MED ORDER — METHYLPHENIDATE HCL ER (CD) 50 MG PO CPCR: 50.0000 mg | ORAL_CAPSULE | ORAL | 0 refills | Status: DC

## 2021-09-07 NOTE — Telephone Encounter (Signed)
pt mother called left message that patient needed Methylphenidate cd 50mg  [facility] [address] and the methylphenidate 10mg  [facility]  pt last seen on 07-26-21 next appt 09-13-21   methylphenidate (METADATE CD) 50 MG CR capsule Medication Date: 07/26/2021 Department: [facility] Ordering/Authorizing: 07/28/2021, MD   Order Providers  Prescribing Provider Encounter Provider  [facility], MD Jomarie Longs, MD   Outpatient Medication Detail   Disp Refills Start End   methylphenidate (METADATE CD) 50 MG CR capsule 30 capsule 0 08/09/2021    Sig - Route: Take 1 capsule (50 mg total) by mouth every morning. - Oral   Sent to pharmacy as: methylphenidate (METADATE CD) 50 MG CR capsule   Earliest Fill Date: 08/09/2021   Notes to Pharmacy: Fill on or after 08/09/2021   E-Prescribing Status: Receipt confirmed by pharmacy (07/26/2021  9:30 AM EDT)      methylphenidate (METADATE CD) 10 MG CR capsule Medication Date: 07/26/2021 Department: [facility] Ordering/Authorizing: 07/28/2021, MD   Order Providers  Prescribing Provider Encounter Provider  [facility], MD Jomarie Longs, MD   Outpatient Medication Detail   Disp Refills Start End   methylphenidate (METADATE CD) 10 MG CR capsule 30 capsule 0 09/01/2021    Sig - Route: Take 1 capsule (10 mg total) by mouth every morning. - Oral   Sent to pharmacy as: methylphenidate (METADATE CD) 10 MG CR capsule   Earliest Fill Date: 09/01/2021   Notes to Pharmacy: Fill on or after 09/01/2021   E-Prescribing Status: Receipt confirmed by pharmacy (07/26/2021  9:33 AM EDT)

## 2021-09-07 NOTE — Telephone Encounter (Signed)
tried to call but it will ring and click office.

## 2021-09-07 NOTE — Telephone Encounter (Signed)
Methylphenidate 50 mg was sent to [facility] on [address].  Methylphenidate 10 mg-patient has a prescription waiting to be filled on or after 09/01/2021.  Please as to contact the [facility] [facility] .

## 2021-09-13 ENCOUNTER — Encounter: Payer: Self-pay | Admitting: Psychiatry

## 2021-09-13 ENCOUNTER — Telehealth (INDEPENDENT_AMBULATORY_CARE_PROVIDER_SITE_OTHER): Payer: Medicare Other | Admitting: Psychiatry

## 2021-09-13 DIAGNOSIS — Z79899 Other long term (current) drug therapy: Secondary | ICD-10-CM

## 2021-09-13 DIAGNOSIS — F39 Unspecified mood [affective] disorder: Secondary | ICD-10-CM | POA: Diagnosis not present

## 2021-09-13 DIAGNOSIS — F70 Mild intellectual disabilities: Secondary | ICD-10-CM | POA: Diagnosis not present

## 2021-09-13 DIAGNOSIS — F902 Attention-deficit hyperactivity disorder, combined type: Secondary | ICD-10-CM | POA: Diagnosis not present

## 2021-09-13 MED ORDER — DIVALPROEX SODIUM 500 MG PO DR TAB: 500.0000 mg | DELAYED_RELEASE_TABLET | Freq: Two times a day (BID) | ORAL | 0 refills | Status: DC

## 2021-09-13 MED ORDER — METHYLPHENIDATE HCL ER (CD) 50 MG PO CPCR: 50.0000 mg | ORAL_CAPSULE | Freq: Every morning | ORAL | 0 refills | Status: DC

## 2021-09-13 MED ORDER — DIVALPROEX SODIUM 250 MG PO DR TAB: 250.0000 mg | DELAYED_RELEASE_TABLET | Freq: Every day | ORAL | 0 refills | Status: DC

## 2021-09-13 MED ORDER — ESCITALOPRAM OXALATE 20 MG PO TABS: 20.0000 mg | ORAL_TABLET | Freq: Every day | ORAL | 0 refills | Status: DC

## 2021-09-13 NOTE — Progress Notes (Unsigned)
Virtual Visit via Video Note  I connected with [name] on 09/13/21 at  3:00 PM EDT by a video enabled telemedicine application and verified that I am speaking with the correct person using two identifiers.  Location Provider Location : [facility] Patient Location : Home  Participants: Patient , Provider    I discussed the limitations of evaluation and management by telemedicine and the availability of in person appointments. The patient expressed understanding and agreed to proceed.    I discussed the assessment and treatment plan with the patient. The patient was provided an opportunity to ask questions and all were answered. The patient agreed with the plan and demonstrated an understanding of the instructions.   The patient was advised to call back or seek an in-person evaluation if the symptoms worsen or if the condition fails to improve as anticipated.   BH MD OP Progress Note  09/13/2021 5:12 PM [name]  MRN:  [id number]  Chief Complaint:  Chief Complaint  Patient presents with   Follow-up: 26 year old Caucasian male with history of episodic mood disorder, ADHD, intellectual disability, presented for medication management.   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  Patient as well as mother- Anthony Braun - legal guardian participated in the evaluation today.  Patient today appeared to be extremely restless, with agitation on and off and needed a lot of redirection.  Patient currently recovering from gastroenteritis and had significant diarrhea for a week or so.  However reports he is currently back at work and feeling better.  Patient reports he has been compliant with medication however when he had the gastroenteritis it is  possible he could not have been very compliant due to the diarrhea.  This likely could be contributing to his current restlessness and agitation.  Patient however reports he has been able to go to work and has been functioning okay at work with any incidents.  Patient denies any suicidality, homicidality or perceptual disturbances.  Patient however appeared to be paranoid about giving away his date of birth as well as his address today when writer asked for it at the beginning of the session, this being a virtual appointment and in order to verify his identity.  Patient however was redirectable.  As per mother -patient has been sleeping better.  However prior to his bedtime he has been doing a lot more activities than usual and does not seem to sit down like he used to before.  He is either cleaning or picking up the garbage can and doing other chores and appears to be restless.  Although once he is in bed he is sleeping better through the night which is an improvement.  Patient has been noncompliant with psychotherapy sessions, was released from his previous therapists since he was not cooperative in sessions and was not following recommendations.  His therapist did give community resources and mother agrees to contact them to establish care.  Patient denies any side effects to medications.  Reviewed most recent labs including BUN/creatinine and TSH-within normal limits dated 08/02/2021.  Lithium level although ordered was not completed.  Agreeable to get this done.      Visit Diagnosis:    ICD-10-CM   1. Episodic mood disorder (HCC)  F39 Lithium level    divalproex (DEPAKOTE)  500 MG DR tablet    divalproex (DEPAKOTE) 250 MG DR tablet    escitalopram (LEXAPRO) 20 MG tablet    2. Attention deficit hyperactivity disorder (ADHD), combined type  F90.2 methylphenidate (METADATE CD) 50 MG CR capsule    3. Mild intellectual disability  F70     4. High risk medication use  Z79.899 Lithium level     divalproex (DEPAKOTE) 500 MG DR tablet      Past Psychiatric History: Reviewed past psychiatric history from progress note on 03/22/2021.  Also reviewed progress note on 04/07/2021.  Past Medical History:  Past Medical History:  Diagnosis Date   Seizures (HCC)    last seizure was at the age of 26   History reviewed. No pertinent surgical history.  Family Psychiatric History: Reviewed family psychiatric history from progress note on 03/22/2021.  Family History:  Family History  Problem Relation Age of Onset   Mental illness Neg Hx     Social History: Reviewed social history from progress note on 03/22/2021. Social History   Socioeconomic History   Marital status: Single    Spouse name: Not on file   Number of children: Not on file   Years of education: Not on file   Highest education level: Not on file  Occupational History   Not on file  Tobacco Use   Smoking status: Never    Passive exposure: Never   Smokeless tobacco: Not on file  Vaping Use   Vaping Use: Never used  Substance and Sexual Activity   Alcohol use: No   Drug use: Never   Sexual activity: Not Currently  Other Topics Concern   Not on file  Social History Narrative   PATIENT HAS LEGAL GUARDIAN - [name],[name] and Anthony Braun ( parents )    Social Determinants of Health   Financial Resource Strain: Not on file  Food Insecurity: Not on file  Transportation Needs: Not on file  Physical Activity: Not on file  Stress: Not on file  Social Connections: Not on file    Allergies: No Known Allergies  Metabolic Disorder Labs: No results found for: "HGBA1C", "MPG" Lab Results  Component Value Date   PROLACTIN 3.2 (L) 03/25/2021   No results found for: "CHOL", "TRIG", "HDL", "CHOLHDL", "VLDL", "LDLCALC" Lab Results  Component Value Date   TSH 4.120 08/02/2021   TSH 3.040 03/25/2021    Therapeutic Level Labs: Lab Results  Component Value Date   LITHIUM 0.7 04/20/2021   Lab Results  Component Value  Date   VALPROATE 89 04/20/2021   VALPROATE 35 (L) 03/25/2021   No results found for: "CBMZ"  Current Medications: Current Outpatient Medications  Medication Sig Dispense Refill   divalproex (DEPAKOTE) 250 MG DR tablet Take 1 tablet (250 mg total) by mouth daily. Take along with 1000 mg daily - total of 1250 mg daily 90 tablet 0   divalproex (DEPAKOTE) 500 MG DR tablet Take 1 tablet (500 mg total) by mouth 2 (two) times daily. 180 tablet 0   escitalopram (LEXAPRO) 20 MG tablet Take 1 tablet (20 mg total) by mouth daily. 90 tablet 0   lithium carbonate 300 MG capsule Take 1-2 capsules (300-600 mg total) by mouth as directed. Take 1 tablet daily AM and 2 tablets daily PM 270 capsule 1   methylphenidate (METADATE CD) 10 MG CR capsule Take 1 capsule (10 mg total) by mouth every morning. 30 capsule 0   methylphenidate (METADATE CD) 50 MG CR capsule Take 1 capsule (50 mg  total) by mouth every morning. 30 capsule 0   [START ON 10/06/2021] methylphenidate (METADATE CD) 50 MG CR capsule Take 1 capsule (50 mg total) by mouth every morning. 30 capsule 0   methylphenidate (RITALIN) 10 MG tablet Take 1 tablet (10 mg total) by mouth every morning. 30 tablet 0   QUEtiapine (SEROQUEL) 50 MG tablet Take 1 tablet (50 mg total) by mouth at bedtime. 90 tablet 0   traZODone (DESYREL) 50 MG tablet Take 1 tablet (50 mg total) by mouth at bedtime as needed for sleep. 90 tablet 0   No current facility-administered medications for this visit.     Musculoskeletal: Strength & Muscle Tone:  UTA Gait & Station:  Seated Patient leans: N/A  Psychiatric Specialty Exam: Review of Systems  Psychiatric/Behavioral:  The patient is nervous/anxious.     There were no vitals taken for this visit.There is no height or weight on file to calculate BMI.  General Appearance: Casual  Eye Contact:  Fair  Speech:  Clear and Coherent  Volume:  Normal  Mood:  Anxious  Affect:  Congruent  Thought Process:  Goal Directed and  Descriptions of Associations: Intact  Orientation:  Full (Time, Place, and Person)  Thought Content: Logical   Suicidal Thoughts:  No  Homicidal Thoughts:  No  Memory:  Immediate;   Fair Recent;   Fair Remote;   Poor  Judgement:  Other:  limited  Insight:  Shallow  Psychomotor Activity:  Restlessness  Concentration:  Concentration: Fair and Attention Span: Fair  Recall:  [employer] of Knowledge: Fair  Language: Fair  Akathisia:  No  Handed:  Right  AIMS (if indicated): not done  Assets:  Therapist, sports  ADL's:  Intact  Cognition: baseline  Sleep:   improved   Screenings: AIMS    Flowsheet Row Video Visit from 09/13/2021 in [facility] Video Visit from 07/26/2021 in [facility] Video Visit from 06/28/2021 in [facility] Office Visit from 04/26/2021 in [facility]  AIMS Total Score 0 0 0 0      GAD-7    Flowsheet Row Video Visit from 07/26/2021 in [facility]  Total GAD-7 Score 6      PHQ2-9    Flowsheet Row Video Visit from 07/26/2021 in [facility] Counselor from 06/15/2021 in [facility] Office Visit from 03/22/2021 in [facility]  PHQ-2 Total Score 0 0 0      Flowsheet Row Counselor from 08/01/2021 in [facility] Video Visit from 07/26/2021 in [facility] Counselor from 06/15/2021 in [facility]  C-SSRS RISK CATEGORY No Risk No Risk No Risk        Assessment and Plan: Wen Merced is a 26 year old Caucasian male, single, on disability, has a history of ADHD, episodic mood disorder, intellectual disability, lives in [place] with his parents who are legal guardians, continues to have restlessness, anxiety although  sleep problems have improved, will benefit from the following plan.  Plan Episodic mood disorder-improving Depakote 1250 mg p.o. daily in divided dosage Lexapro 20 mg p.o. daily Lithium 300 mg p.o. daily in the morning, 600 mg every afternoon. Seroquel 50 mg p.o. nightly Trazodone 50 mg p.o. nightly as needed for sleep Lithium level-04/20/2021-0.7-therapeutic Valproic acid level-89-therapeutic-04/20/2021. Will consider readjusting the dosage however will repeat lithium level since this was not completed as requested. Patient however will benefit  from psychotherapy sessions, encouraged to establish care with a new therapist.  ADHD-stable Metadate CD 50 mg p.o. daily Ritalin 10 mg p.o. daily in the afternoon. Reviewed [place] PMP aware.  Mild intellectual disability-chronic Continue CBT  High risk medication use-reviewed and discussed BUN plus creatinine-08/02/2021-within normal limits, TSH-within normal limits. Lithium level pending-will order again.  Encouraged to get it done.  Collateral information obtained from mother as noted above.  Reviewed notes per Ms. Christina Hussami -dated 08/01/2021-patient was released from her care due to not following recommendations.  Discussed with patient as well as mother the need for following recommendations and that if he is not then he may need to be referred out.   Follow-up in clinic in 4 to 6 weeks or sooner if needed.  Patient advised to come into the office for in person visit.   Collaboration of Care: Collaboration of Care: Referral or follow-up with counselor/therapist AEB encouraged to establish care with therapist.  Patient/Guardian was advised Release of Information must be obtained prior to any record release in order to collaborate their care with an outside provider. Patient/Guardian was advised if they have not already done so to contact the registration department to sign all necessary forms in order for [place] to release information  regarding their care.   Consent: Patient/Guardian gives verbal consent for treatment and assignment of benefits for services provided during this visit. Patient/Guardian expressed understanding and agreed to proceed.   This note was generated in part or whole with voice recognition software. Voice recognition is usually quite accurate but there are transcription errors that can and very often do occur. I apologize for any typographical errors that were not detected and corrected.      Jomarie Longs, MD 09/14/2021, 1:32 PM

## 2021-09-26 ENCOUNTER — Telehealth: Payer: Self-pay

## 2021-09-26 DIAGNOSIS — F902 Attention-deficit hyperactivity disorder, combined type: Secondary | ICD-10-CM

## 2021-09-26 MED ORDER — METHYLPHENIDATE HCL ER (CD) 10 MG PO CPCR: 10.0000 mg | ORAL_CAPSULE | ORAL | 0 refills | Status: DC

## 2021-09-26 NOTE — Telephone Encounter (Signed)
pt mother called states that [facility] needs refills on the mehylphenidate 10mg  ritalin

## 2021-09-26 NOTE — Telephone Encounter (Signed)
I have sent methylphenidate 10 mg to [facility].

## 2021-09-27 NOTE — Telephone Encounter (Signed)
Left message that rx was sent to the pharmacy  

## 2021-09-29 ENCOUNTER — Telehealth: Payer: Self-pay

## 2021-09-29 DIAGNOSIS — F902 Attention-deficit hyperactivity disorder, combined type: Secondary | ICD-10-CM

## 2021-09-29 MED ORDER — METHYLPHENIDATE HCL 10 MG PO TABS: 10.0000 mg | ORAL_TABLET | Freq: Every day | ORAL | 0 refills | Status: DC

## 2021-09-29 NOTE — Telephone Encounter (Signed)
I have sent methylphenidate 10 mg to pharmacy-24-month supply, date specified.

## 2021-09-29 NOTE — Telephone Encounter (Signed)
Disp Refills Start End   methylphenidate (METADATE CD) 10 MG CR capsule 30 capsule 0 09/26/2021    Sig - Route: Take 1 capsule (10 mg total) by mouth every morning. - Oral   Sent to pharmacy as: methylphenidate (METADATE CD) 10 MG CR capsule   Earliest Fill Date: 09/26/2021   Notes to Pharmacy: Fill when due   E-Prescribing Status: Receipt confirmed by pharmacy (09/26/2021  4:54 PM EDT)   Methylphenidate 10 mg was already sent to pharmacy on 09/26/2021.  Please contact pharmacy to verify, please let patient as well as mother know.

## 2021-09-29 NOTE — Telephone Encounter (Signed)
pt mother called and told that rx was sent into pharmacy with additional refills.

## 2021-09-29 NOTE — Telephone Encounter (Signed)
pt mother called states that they still needed the methylphenidate short acting 10mg  sent to the pharmacy.    methylphenidate (RITALIN) 10 MG tablet Medication Date: 07/26/2021 Department: [facility] Ordering/Authorizing: [facility], MD   Order Providers  Prescribing Provider Encounter Provider  Jomarie Longs, MD Jomarie Longs, MD   Outpatient Medication Detail   Disp Refills Start End   methylphenidate (RITALIN) 10 MG tablet 30 tablet 0 08/04/2021    Sig - Route: Take 1 tablet (10 mg total) by mouth every morning. - Oral   Sent to pharmacy as: methylphenidate (RITALIN) 10 MG tablet   Earliest Fill Date: 08/04/2021   Notes to Pharmacy: Fill on or after 08/04/2021   E-Prescribing Status: Receipt confirmed by pharmacy (07/26/2021  9:29 AM EDT)

## 2021-09-29 NOTE — Telephone Encounter (Signed)
per the patient mother you are sending in the CD they don't needed the CD   they just needed to plain ritalin 10mg    methylphenidate (RITALIN) 10 MG tablet 30 tablet 0 08/04/2021    Sig - Route: Take 1 tablet (10 mg total) by mouth every morning. - Oral   Sent to pharmacy as: methylphenidate (RITALIN) 10 MG tablet   Earliest Fill Date: 08/04/2021   Notes to Pharmacy: Fill on or after 08/04/2021   E-Prescribing Status: Receipt confirmed by pharmacy (07/26/2021  9:29 AM EDT)

## 2021-09-30 LAB — LITHIUM LEVEL: Lithium Lvl: 0.4 mmol/L — ABNORMAL LOW (ref 0.5–1.2)

## 2021-11-01 ENCOUNTER — Encounter: Payer: Self-pay | Admitting: Psychiatry

## 2021-11-01 ENCOUNTER — Ambulatory Visit (INDEPENDENT_AMBULATORY_CARE_PROVIDER_SITE_OTHER): Payer: Medicare Other | Admitting: Psychiatry

## 2021-11-01 DIAGNOSIS — F39 Unspecified mood [affective] disorder: Secondary | ICD-10-CM | POA: Diagnosis not present

## 2021-11-01 DIAGNOSIS — F902 Attention-deficit hyperactivity disorder, combined type: Secondary | ICD-10-CM | POA: Diagnosis not present

## 2021-11-01 DIAGNOSIS — Z9189 Other specified personal risk factors, not elsewhere classified: Secondary | ICD-10-CM | POA: Diagnosis not present

## 2021-11-01 DIAGNOSIS — F70 Mild intellectual disabilities: Secondary | ICD-10-CM

## 2021-11-01 MED ORDER — METHYLPHENIDATE HCL ER (CD) 50 MG PO CPCR: 50.0000 mg | ORAL_CAPSULE | Freq: Every morning | ORAL | 0 refills | Status: DC

## 2021-11-01 MED ORDER — METHYLPHENIDATE HCL ER (CD) 50 MG PO CPCR: 50.0000 mg | ORAL_CAPSULE | ORAL | 0 refills | Status: DC

## 2021-11-01 MED ORDER — QUETIAPINE FUMARATE 100 MG PO TABS: 100.0000 mg | ORAL_TABLET | Freq: Every day | ORAL | 0 refills | Status: DC

## 2021-11-01 MED ORDER — METHYLPHENIDATE HCL 10 MG PO TABS: 10.0000 mg | ORAL_TABLET | Freq: Every day | ORAL | 0 refills | Status: DC

## 2021-11-01 MED ORDER — LITHIUM CARBONATE 300 MG PO CAPS: 300.0000 mg | ORAL_CAPSULE | ORAL | 1 refills | Status: DC

## 2021-11-01 NOTE — Patient Instructions (Signed)
Please call for EKG-[id number] 

## 2021-11-01 NOTE — Progress Notes (Unsigned)
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  Patient as well as mother-[name]-legal guardian participated in the evaluation today.  Patient today appeared to be alert, oriented to person place time situation.  Patient reports he continues to have anger issues especially at work and he has to walk away from certain situations so that he does not act out.  Patient reports he also continues to struggle with sleep.  He drinks a lot of fluid throughout the day and hence sleep is interrupted since he has to urinate.  Patient denies any suicidality or homicidality.  Denies any perceptual disturbances.  Patient denies any side effects to medications.  Reports he is compliant on them.  Per collateral information from mother, patient does have anger issues and needs a lot of redirection.  Does struggle with sleep.  Recently had neurology appointment with Dr.Potter, Seroquel dosage was recently increased to 75 mg unknown if it is helpful.  Reviewed notes per Dr.Potter on 01/25/2022--patient with history of seizure disorder-last known seizures were in 2012 and 2003.  Reviewed routine and 3-hour EEGs which were within normal limits.  Patient more likely has epilepsy given the history.  Could consider 72-hour EEG in the future however will defer at this time.  Patient's Seroquel was increased to 75 mg at bedtime.'  Currently looking to establish care with a therapist.  Denies any other concerns today.    Visit Diagnosis:    ICD-10-CM   1. Episodic mood disorder (HCC)  F39 lithium carbonate 300 MG capsule    QUEtiapine (SEROQUEL)  100 MG tablet    EKG 12-Lead    2. Attention deficit hyperactivity disorder (ADHD), combined type  F90.2 methylphenidate (METADATE CD) 50 MG CR capsule    methylphenidate (METADATE CD) 50 MG CR capsule    methylphenidate (RITALIN) 10 MG tablet    methylphenidate (RITALIN) 10 MG tablet    3. Mild intellectual disability  F70     4. At risk for prolonged QT interval syndrome  Z91.89 EKG 12-Lead      Past Psychiatric History: Reviewed past psychiatric history from progress note on 03/22/2021.  Also reviewed progress note from 04/07/2021.  Past Medical History:  Past Medical History:  Diagnosis Date   Seizures ([place])    last seizure was at the age of 22   History reviewed. No pertinent surgical history.  Family Psychiatric History: Reviewed family psychiatric history from progress note on 03/22/2021.  Family History:  Family History  Problem Relation Age of Onset   Mental illness Neg Hx     Social History: Reviewed social history from progress note on 03/22/2021. Social History   Socioeconomic History   Marital status: Single    Spouse name: Not on file   Number of children: Not on file   Years of education: Not on file   Highest education level: Not on file  Occupational History   Not on file  Tobacco Use   Smoking status: Never    Passive exposure: Never   Smokeless tobacco: Not on file  Vaping Use   Vaping Use: Never used  Substance and Sexual Activity  Alcohol use: No   Drug use: Never   Sexual activity: Not Currently  Other Topics Concern   Not on file  Social History Narrative   PATIENT HAS LEGAL GUARDIAN - [name],[name] and [name] ( parents )    Social Determinants of Health   Financial Resource Strain: Not on file  Food Insecurity: Not on file  Transportation Needs: Not on file  Physical Activity: Not on file  Stress: Not on file  Social Connections: Not on file    Allergies: No Known Allergies  Metabolic Disorder Labs: No results found for:  "HGBA1C", "MPG" Lab Results  Component Value Date   PROLACTIN 3.2 (L) 03/25/2021   No results found for: "CHOL", "TRIG", "HDL", "CHOLHDL", "VLDL", "LDLCALC" Lab Results  Component Value Date   TSH 4.120 08/02/2021   TSH 3.040 03/25/2021    Therapeutic Level Labs: Lab Results  Component Value Date   LITHIUM 0.4 (L) 09/29/2021   LITHIUM 0.7 04/20/2021   Lab Results  Component Value Date   VALPROATE 89 04/20/2021   VALPROATE 35 (L) 03/25/2021   No results found for: "CBMZ"  Current Medications: Current Outpatient Medications  Medication Sig Dispense Refill   divalproex (DEPAKOTE) 250 MG DR tablet Take 1 tablet (250 mg total) by mouth daily. Take along with 1000 mg daily - total of 1250 mg daily 90 tablet 0   divalproex (DEPAKOTE) 500 MG DR tablet Take 1 tablet (500 mg total) by mouth 2 (two) times daily. 180 tablet 0   escitalopram (LEXAPRO) 20 MG tablet Take 1 tablet (20 mg total) by mouth daily. 90 tablet 0   [START ON 12/03/2021] methylphenidate (METADATE CD) 50 MG CR capsule Take 1 capsule (50 mg total) by mouth every morning. 30 capsule 0   [START ON 11/29/2021] methylphenidate (RITALIN) 10 MG tablet Take 1 tablet (10 mg total) by mouth daily after lunch. 30 tablet 0   QUEtiapine (SEROQUEL) 100 MG tablet Take 1 tablet (100 mg total) by mouth at bedtime. Stop quetiapine 50 mg and 25 mg at bedtime 90 tablet 0   traZODone (DESYREL) 50 MG tablet Take 1 tablet (50 mg total) by mouth at bedtime as needed for sleep. 90 tablet 0   lithium carbonate 300 MG capsule Take 1-2 capsules (300-600 mg total) by mouth as directed. Take 1 tablet daily AM and 2 tablets daily PM 270 capsule 1   [START ON 11/04/2021] methylphenidate (METADATE CD) 50 MG CR capsule Take 1 capsule (50 mg total) by mouth every morning. 30 capsule 0   methylphenidate (RITALIN) 10 MG tablet Take 1 tablet (10 mg total) by mouth daily after lunch. 30 tablet 0   No current facility-administered medications for this visit.      Musculoskeletal: Strength & Muscle Tone:  wnl Gait & Station: normal Patient leans: N/A  Psychiatric Specialty Exam: Review of Systems  Musculoskeletal:  Positive for back pain.  Psychiatric/Behavioral:  Positive for dysphoric mood and sleep disturbance. The patient is nervous/anxious.   All other systems reviewed and are negative.   There were no vitals taken for this visit.There is no height or weight on file to calculate BMI.  General Appearance: Casual  Eye Contact:  Fair  Speech:  Clear and Coherent  Volume:  Normal  Mood:  Anxious and Depressed  Affect:  Congruent  Thought Process:  Linear and Descriptions of Associations: Circumstantial  Orientation:  Full (Time, Place, and Person)  Thought Content: Rumination   Suicidal Thoughts:  No  Homicidal Thoughts:  No  Memory:  Immediate;   Fair Recent;   Fair Remote;   Poor  Judgement:  Fair  Insight:  Shallow  Psychomotor Activity:  Normal  Concentration:  Concentration: Fair and Attention Span: Fair  Recall:  [employer] of Knowledge: Fair  Language: Fair  Akathisia:  No  Handed:  Right  AIMS (if indicated): done  Assets:  Communication Skills Desire for Improvement Housing Social Support Talents/Skills  ADL's:  Intact  Cognition: WNL  Sleep:  Poor   Screenings: AIMS    Flowsheet Row Office Visit from 11/01/2021 in [facility] Video Visit from 09/13/2021 in [facility] Video Visit from 07/26/2021 in [facility] Video Visit from 06/28/2021 in [facility] Office Visit from 04/26/2021 in [facility]  AIMS Total Score 0 0 0 0 0      GAD-7    Flowsheet Row Video Visit from 07/26/2021 in [facility]  Total GAD-7 Score 6      PHQ2-9    Flowsheet Row Video Visit from 07/26/2021 in [facility] Counselor from  06/15/2021 in [facility] Office Visit from 03/22/2021 in [facility]  PHQ-2 Total Score 0 0 0      Flowsheet Row Office Visit from 11/01/2021 in [facility] Counselor from 08/01/2021 in [facility] Video Visit from 07/26/2021 in [facility]  C-SSRS RISK CATEGORY No Risk No Risk No Risk        Assessment and Plan: [name] is a 26 year old Caucasian male, single, on disability, has a history of ADHD, episodic mood disorder, intellectual disability, lives in [place] with his parents who are his legal guardians, continues to have sleep problems, anger issues, will benefit from the following plan.  Plan Episodic mood disorder-unstable Depakote 1250 mg p.o. daily in divided dosage Lexapro 20 mg p.o. daily Lithium 300 mg p.o. daily in the morning and 600 mg every afternoon. Increase Seroquel to 100 mg p.o. nightly Trazodone 50 mg p.o. nightly as needed for sleep Reviewed lithium level and discussed with patient-09/29/2021-0.4-subtherapeutic.  Will not make any changes with lithium dosage since he is on multiple mood stabilizers. Valproic acid level-89-therapeutic-04/20/2021. Patient will benefit from psychotherapy-agreeable to establish care.  Patient has a case Production designer, theatre/television/film with [employer] who has given him a list of therapist, mother agrees to contact to establish care.  ADHD-stable Metadate CD 50 mg p.o. daily Ritalin 10 mg p.o. daily in the afternoon Provided multiple prescriptions with date specified. Reviewed [place] PMP aware   Mild intellectual disability-chronic Continue CBT  At risk for prolonged QT syndrome-we will order EKG-provided information-call [phone].  Reviewed notes per Dr. Kenna Gilbert 10/22/2021 as noted above.  Seroquel dosage was readjusted.  EEG-within normal limits.  Consider 72-hour EEG in the future as  needed.  Collateral information obtained from mother as noted above.  Follow-up in clinic in 6 to 8 weeks or sooner if needed.   This note was generated in part or whole with voice recognition software. Voice recognition is usually quite accurate but there are transcription errors that can and very often do occur. I apologize for any typographical errors that were not detected and corrected.    Jomarie Longs, MD 11/02/2021, 8:24 AM

## 2021-12-06 ENCOUNTER — Ambulatory Visit
Admission: RE | Admit: 2021-12-06 | Discharge: 2021-12-06 | Disposition: A | Discharge status: Home / Self Care | Payer: Medicare Other | Source: Ambulatory Visit | Attending: Family Medicine | Admitting: Family Medicine

## 2021-12-06 DIAGNOSIS — Z9189 Other specified personal risk factors, not elsewhere classified: Secondary | ICD-10-CM | POA: Diagnosis not present

## 2021-12-06 DIAGNOSIS — F39 Unspecified mood [affective] disorder: Secondary | ICD-10-CM | POA: Diagnosis present

## 2021-12-19 ENCOUNTER — Other Ambulatory Visit: Payer: Self-pay | Admitting: Psychiatry

## 2021-12-19 DIAGNOSIS — F39 Unspecified mood [affective] disorder: Secondary | ICD-10-CM

## 2021-12-23 ENCOUNTER — Telehealth: Payer: Self-pay | Admitting: Psychiatry

## 2021-12-23 NOTE — Telephone Encounter (Signed)
Mother stopped by to drop off paperwork to be filled out. She is trying to get Okey in a group home in [place]. Wanting to know if you will fill out. I have put in your box. Please call her with any questions. They are out of town week of Thanksgiving but can come pick it up Nov. 27 or you can fax to the home. Thank you

## 2021-12-23 NOTE — Telephone Encounter (Signed)
Ok noted  

## 2021-12-26 ENCOUNTER — Telehealth: Payer: Self-pay | Admitting: Psychiatry

## 2021-12-26 NOTE — Telephone Encounter (Signed)
I have completed Adult  care home FL2 form for this patient.  However needs a release of information signed by patient's legal guardian-Ms. Saralyn Pilar.  And you could call her to come pick up the completed form.  Form with Shanda Bumps CMA - completed by this Clinical research associate.

## 2021-12-28 NOTE — Telephone Encounter (Signed)
Copy made and put in scan basket, faxed and then mailed the orginial

## 2022-01-03 ENCOUNTER — Encounter: Payer: Self-pay | Admitting: Psychiatry

## 2022-01-03 ENCOUNTER — Telehealth (INDEPENDENT_AMBULATORY_CARE_PROVIDER_SITE_OTHER): Payer: Medicare Other | Admitting: Psychiatry

## 2022-01-03 DIAGNOSIS — F902 Attention-deficit hyperactivity disorder, combined type: Secondary | ICD-10-CM | POA: Diagnosis not present

## 2022-01-03 DIAGNOSIS — F39 Unspecified mood [affective] disorder: Secondary | ICD-10-CM

## 2022-01-03 DIAGNOSIS — Z79899 Other long term (current) drug therapy: Secondary | ICD-10-CM | POA: Diagnosis not present

## 2022-01-03 DIAGNOSIS — F70 Mild intellectual disabilities: Secondary | ICD-10-CM | POA: Diagnosis not present

## 2022-01-03 MED ORDER — DIVALPROEX SODIUM 500 MG PO DR TAB: 500.0000 mg | DELAYED_RELEASE_TABLET | ORAL | 0 refills | Status: DC

## 2022-01-03 MED ORDER — ESCITALOPRAM OXALATE 20 MG PO TABS: 20.0000 mg | ORAL_TABLET | Freq: Every day | ORAL | 0 refills | Status: DC

## 2022-01-03 MED ORDER — QUETIAPINE FUMARATE ER 150 MG PO TB24: 150.0000 mg | ORAL_TABLET | Freq: Every day | ORAL | 0 refills | Status: DC

## 2022-01-03 MED ORDER — TRAZODONE HCL 50 MG PO TABS: 50.0000 mg | ORAL_TABLET | Freq: Every evening | ORAL | 0 refills | Status: DC | PRN

## 2022-01-03 MED ORDER — METHYLPHENIDATE HCL ER (CD) 50 MG PO CPCR: 50.0000 mg | ORAL_CAPSULE | ORAL | 0 refills | Status: DC

## 2022-01-03 MED ORDER — METHYLPHENIDATE HCL ER (CD) 50 MG PO CPCR: 50.0000 mg | ORAL_CAPSULE | Freq: Every morning | ORAL | 0 refills | Status: DC

## 2022-01-03 MED ORDER — METHYLPHENIDATE HCL 10 MG PO TABS: 10.0000 mg | ORAL_TABLET | Freq: Every day | ORAL | 0 refills | Status: DC

## 2022-01-03 NOTE — Telephone Encounter (Signed)
I am currently with this patient and patient's mother wants to know if the [facility] was mailed or not. She was planning to come pick up as my previous phone note to you stated.  Please call and advise patient's legal guardian - mother [name] ).

## 2022-01-03 NOTE — Progress Notes (Signed)
Virtual Visit via Video Note  I connected with Anthony Earingavid Bloodworth on 01/03/22 at  9:30 AM EST by a video enabled telemedicine application and verified that I am speaking with the correct person using two identifiers.  Location Provider Location : [facility] Patient Location : Home  Participants: Patient ,Mother, Provider   I discussed the limitations of evaluation and management by telemedicine and the availability of in person appointments. The patient expressed understanding and agreed to proceed.    I discussed the assessment and treatment plan with the patient. The patient was provided an opportunity to ask questions and all were answered. The patient agreed with the plan and demonstrated an understanding of the instructions.   The patient was advised to call back or seek an in-person evaluation if the symptoms worsen or if the condition fails to improve as anticipated.   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  Patient as well as mother-[name]-legal guardian participated in the evaluation today.  Patient today appeared to be not engaged in the session.  Had to be redirected several times.  Mother hence provided a lot of collateral information.  Patient's mother reports patient continues to have episodes of irritability,mood swings.  Per mother he can be 'nice' at times and really ' mean" at other times.  Mother believes patient continues to need medication readjustment.  He has been sleeping fairly well though.  Denies side effects to medications.  Patient appeared to be alert, oriented to person ,situation and  date.  Patient denied any side effects or concerns about medications.  Per review of medical records from Dr. Regino SchultzePotter-patient recently had a visit -and was started on Lamictal.  Patient is currently being tapered up on the Lamictal.  Discussed with patient as well as mother about drug to drug interaction between Lamictal and Depakote including increased levels of Lamictal, increased  side effects of Lamictal when taken together with Depakote as well as Stevens-Johnson syndrome risk.  Mother as well as patient agreeable to taper off the Depakote since Lamictal has been added.  Denies any other concerns today.    Visit Diagnosis:    ICD-10-CM   1. Episodic mood disorder (HCC)  F39 Lithium level    BUN+Creat    QUEtiapine Fumarate (SEROQUEL XR) 150 MG 24 hr tablet    traZODone (DESYREL) 50 MG tablet    escitalopram (LEXAPRO) 20 MG tablet    divalproex (DEPAKOTE) 500 MG DR tablet    2. Attention deficit hyperactivity disorder (ADHD), combined type  F90.2 methylphenidate (RITALIN) 10 MG tablet    methylphenidate (RITALIN) 10 MG tablet    methylphenidate (METADATE CD) 50 MG CR capsule    methylphenidate (METADATE CD) 50 MG CR capsule    3. Mild intellectual disability  F70     4. High risk medication use  Z79.899 Lithium level    BUN+Creat    TSH    divalproex (DEPAKOTE) 500 MG DR tablet      Past Psychiatric History: Reviewed past psychiatric history from progress note on 03/22/2021.  Also reviewed progress note on 04/07/2021.  Past Medical History:  Past Medical  History:  Diagnosis Date   Seizures (HCC)    last seizure was at the age of 5   History reviewed. No pertinent surgical history.  Family Psychiatric History: Reviewed family psychiatric history from progress note on 03/22/2021.  Family History:  Family History  Problem Relation Age of Onset   Mental illness Neg Hx     Social History: Reviewed social history from progress note on 03/22/2021. Social History    Socioeconomic History   Marital status: Single    Spouse name: Not on file   Number of children: Not on file   Years of education: Not on file   Highest education level: Not on file  Occupational History   Not on file  Tobacco Use   Smoking status: Never    Passive exposure: Never   Smokeless tobacco: Not on file  Vaping Use   Vaping Use: Never used  Substance and Sexual Activity   Alcohol use: No   Drug use: Never   Sexual activity: Not Currently  Other Topics Concern   Not on file  Social History Narrative   PATIENT HAS LEGAL GUARDIAN - [name],[name] and [name] ( parents )    Social Determinants of Corporate investment banker Strain: Not on file  Food Insecurity: Not on file  Transportation Needs: Not on file  Physical Activity: Not on file  Stress: Not on file  Social Connections: Not on file    Allergies: No Known Allergies  Metabolic Disorder Labs: No results found for: "HGBA1C", "MPG" Lab Results  Component Value Date   PROLACTIN 3.2 (L) 03/25/2021   No results found for: "CHOL", "TRIG", "HDL", "CHOLHDL", "VLDL", "LDLCALC" Lab Results  Component Value Date   TSH 4.120 08/02/2021   TSH 3.040 03/25/2021    Therapeutic Level Labs: Lab Results  Component Value Date   LITHIUM 0.4 (L) 09/29/2021   LITHIUM 0.7 04/20/2021   Lab Results  Component Value Date   VALPROATE 89 04/20/2021   VALPROATE 35 (L) 03/25/2021   No results found for: "CBMZ"  Current Medications: Current Outpatient Medications  Medication Sig Dispense Refill   lamoTRIgine (LAMICTAL) 25 MG tablet Take 25 mg for titration as follows, Wk 1-2: take 1 pill AM, Wk 3-4: take 1 pill twice daily, Wk 5-6: take 2 pills AM and 1 pill PM. Wk 7-8: take 2 pills twice daily.     QUEtiapine Fumarate (SEROQUEL XR) 150 MG 24 hr tablet Take 1 tablet (150 mg total) by mouth at bedtime. Dose increase, change to ER ( Stop Seroquel 100 mg ) 90 tablet 0   divalproex (DEPAKOTE) 500 MG DR tablet Take 1-2  tablets (500-1,000 mg total) by mouth as directed. Take 500 mg twice a day for 2 weeks and reduce to 500 mg once daily after that - being tapered off 90 tablet 0   escitalopram (LEXAPRO) 20 MG tablet Take 1 tablet (20 mg total) by mouth daily. 90 tablet 0   lithium carbonate 300 MG capsule Take 1-2 capsules (300-600 mg total) by mouth as directed. Take 1 tablet daily AM and 2 tablets daily PM 270 capsule 1   methylphenidate (METADATE CD) 50 MG CR capsule Take 1 capsule (50 mg total) by mouth every morning. 30 capsule 0   [START ON 02/02/2022] methylphenidate (METADATE CD) 50 MG CR capsule Take 1 capsule (50 mg total) by mouth every morning. 30 capsule 0   methylphenidate (RITALIN) 10 MG tablet Take 1 tablet (10 mg total) by mouth daily after  lunch. 30 tablet 0   [START ON 02/02/2022] methylphenidate (RITALIN) 10 MG tablet Take 1 tablet (10 mg total) by mouth daily after lunch. 30 tablet 0   traZODone (DESYREL) 50 MG tablet Take 1 tablet (50 mg total) by mouth at bedtime as needed for sleep. 90 tablet 0   No current facility-administered medications for this visit.     Musculoskeletal: Strength & Muscle Tone:  UTA Gait & Station: normal Patient leans: N/A  Psychiatric Specialty Exam: Review of Systems  Unable to perform ROS: Psychiatric disorder    There were no vitals taken for this visit.There is no height or weight on file to calculate BMI.  General Appearance: Casual  Eye Contact:  Fair  Speech:  Normal Rate  Volume:  Normal  Mood:  Irritable  Affect:  Congruent  Thought Process:  Goal Directed and Descriptions of Associations: Intact  Orientation:  Full (Time, Place, and Person)  Thought Content: Logical   Suicidal Thoughts:  No  Homicidal Thoughts:  No  Memory:  Immediate;   Fair Recent;   Fair Remote;   Poor  Judgement:  Fair  Insight:  Shallow  Psychomotor Activity:  Restlessness  Concentration:  Concentration: Fair and Attention Span: Fair  Recall:  [employer] of  Knowledge: Fair  Language: Fair  Akathisia:  No  Handed:  Right  AIMS (if indicated): not done  Assets:  Communication Skills Desire for Improvement Housing Social Support  ADL's:  Intact  Cognition: WNL  Sleep:  Fair   Screenings: AIMS    Flowsheet Row Office Visit from 11/01/2021 in [facility] Video Visit from 09/13/2021 in [facility] Video Visit from 07/26/2021 in [facility] Video Visit from 06/28/2021 in [facility] Office Visit from 04/26/2021 in [facility]  AIMS Total Score 0 0 0 0 0      GAD-7    Flowsheet Row Video Visit from 07/26/2021 in [facility]  Total GAD-7 Score 6      PHQ2-9    Flowsheet Row Video Visit from 07/26/2021 in [facility] Counselor from 06/15/2021 in [facility] Office Visit from 03/22/2021 in [facility]  PHQ-2 Total Score 0 0 0      Flowsheet Row Video Visit from 01/03/2022 in [facility] Office Visit from 11/01/2021 in [facility] Counselor from 08/01/2021 in [facility]  C-SSRS RISK CATEGORY No Risk No Risk No Risk        Assessment and Plan: Presley Summerlin is a 26 year old Caucasian male, single, on disability, has a history of ADHD, episodic mood disorder, intellectual disability lives in [place] with his parents who are his legal guardians, continues to have mood lability, was evaluated by telemedicine today.  Discussed plan as noted below.  Plan Episodic mood disorder-unstable Per review of notes per neurology-Dr. Malvin Johns dated 12/20/2021-patient started on Lamictal and dosages currently being titrated. Since Lamictal is also a mood stabilizer will start tapering him off of the Depakote. Per review  of clinicalkey - "coadministration of valproic acid with lamotrigine can decrease elimination of lamotrigine.  Valproic acid more than doubles half-life of lamotrigine in both pediatric and outpatient.  In the steady-state study involving 10 healthy volunteers, elimination half-life of lamotrigine increased from 26 to 70 hours with valproate, administration which is 165%  increase.  The decrease in apparent clearance of lamotrigine may occur through inhibition of  lamotrigine metabolism.  Serious skin reactions like Stevens-Johnson syndrome and toxic epidermal necrolysis has been reported with lamotrigine and valproate administration." Patient as well as legal guardian agreeable to dosage reduction and tapering off of Depakote. Will reduce Depakote to 500 mg twice a day for 2 weeks and then reduce to Depakote 500 mg daily until his next visit. Lithium 300 mg p.o. daily in the morning and 600 mg q. afternoon. Change Seroquel to extended release and increase to 150 mg p.o. nightly.  ADHD-stable Metadate CD 50 mg p.o. daily-provided 2 prescriptions with date specified Ritalin 10 mg p.o. daily-provided 2 prescriptions with date specified. Reviewed [place] AWARxE  Mild intellectual disability-chronic Completed FL2 for this patient, I have sent communication to CMA to contact this patient to assist [facility] for group home placement) Patient to continue CBT  High risk medication use-will order lithium level, kidney function as well as TSH.  I have printed out lab slip with instructions for CMA to contact legal guardian to come pick up or mail it to them.  Also discussed to go to lab for since order is also in the system.  Reviewed notes per neurologist-12/20/2021 as noted above.  Collateral information obtained from mother as noted above.  Follow-up in clinic in 4 to 6 weeks or sooner in person.   Collaboration of Care: Collaboration of Care: Referral or follow-up with counselor/therapist AEB encouraged to  follow up with therapist.  Patient/Guardian was advised Release of Information must be obtained prior to any record release in order to collaborate their care with an outside provider. Patient/Guardian was advised if they have not already done so to contact the registration department to sign all necessary forms in order for [place] to release information regarding their care.   Consent: Patient/Guardian gives verbal consent for treatment and assignment of benefits for services provided during this visit. Patient/Guardian expressed understanding and agreed to proceed.   This note was generated in part or whole with voice recognition software. Voice recognition is usually quite accurate but there are transcription errors that can and very often do occur. I apologize for any typographical errors that were not detected and corrected.      Jomarie Longs, MD 01/03/2022, 12:49 PM

## 2022-01-03 NOTE — Patient Instructions (Signed)
Coadministration of valproic acid with lamotrigine can decrease the elimination of lamotrigine. Valproic acid more than doubles the elimination half-life of lamotrigine in both pediatric and adult patients. In a steady-state study involving 10 healthy volunteers, the elimination half-life of lamotrigine increased from 26 to 70 hours with valproate coadministration (a 165% increase). The decrease in apparent clearance of lamotrigine may occur via inhibition of lamotrigine metabolism through competition for liver glucuronidation sites. Serious skin reactions (such as Stevens-Johnson Syndrome and toxic epidermal necrolysis) have been reported with concomitant lamotrigine and valproate administration. In any patient receiving valproic acid, lamotrigine must be initiated at a reduced dosage that is less than half the dose used in patients not receiving valproic acid. In controlled clinical trials, lamotrigine had no appreciable effect on plasma valproic acid concentrations when added to existing valproic acid therapies. If valproic acid therapy is discontinued, lamotrigine doses may need to be adjusted upward. The inhibitory effects of valproic acid on lamotrigine elimination may offset the actions of other anticonvulsants with known hepatic enzyme-inducing properties on lamotrigine clearance.

## 2022-01-25 ENCOUNTER — Telehealth: Payer: Self-pay

## 2022-01-25 LAB — BUN+CREAT
BUN/Creatinine Ratio: 18 (ref 9–20)
BUN: 16 mg/dL (ref 6–20)
Creatinine, Ser: 0.89 mg/dL (ref 0.76–1.27)
eGFR: 120 mL/min/{1.73_m2} (ref >59)

## 2022-01-25 LAB — TSH: TSH: 5.73 u[IU]/mL — ABNORMAL HIGH (ref 0.450–4.500)

## 2022-01-25 LAB — LITHIUM LEVEL: Lithium Lvl: 0.6 mmol/L (ref 0.5–1.2)

## 2022-01-25 NOTE — Telephone Encounter (Signed)
Spoke to mother about the patients lab results let her know that the patients TSH is elevated and that she needs to contact the patients PCP she voiced understanding

## 2022-02-07 ENCOUNTER — Other Ambulatory Visit: Payer: Self-pay | Admitting: Psychiatry

## 2022-02-07 DIAGNOSIS — F902 Attention-deficit hyperactivity disorder, combined type: Secondary | ICD-10-CM

## 2022-02-28 ENCOUNTER — Telehealth: Payer: Self-pay

## 2022-02-28 DIAGNOSIS — F902 Attention-deficit hyperactivity disorder, combined type: Secondary | ICD-10-CM

## 2022-02-28 MED ORDER — METHYLPHENIDATE HCL ER (CD) 50 MG PO CPCR: 50.0000 mg | ORAL_CAPSULE | Freq: Every morning | ORAL | 0 refills | Status: DC

## 2022-02-28 MED ORDER — METHYLPHENIDATE HCL 10 MG PO TABS: 10.0000 mg | ORAL_TABLET | Freq: Every day | ORAL | 0 refills | Status: DC

## 2022-02-28 NOTE — Addendum Note (Signed)
Addended byUrsula Alert on: 02/28/2022 01:19 PM   Modules accepted: Orders

## 2022-02-28 NOTE — Telephone Encounter (Signed)
[name] mother of patient called to request a refill for the following medication   Last visit 01/04/23 Next visit 03/16/22    Disp Refills Start End    methylphenidate (RITALIN) 10 MG tablet 30 tablet 0 02/07/2022    Sig - Route: Take 1 tablet (10 mg total) by mouth daily after lunch. - Oral   Sent to pharmacy as: methylphenidate (RITALIN) 10 MG table      Disp Refills Start End    methylphenidate (METADATE CD) 50 MG CR capsule 30 capsule 0 01/03/2022    Sig - Route: Take 1 capsule (50 mg total) by mouth every morning. - Oral   Sent to pharmacy as: methylphenidate (METADATE CD) 50 MG CR capsule

## 2022-02-28 NOTE — Addendum Note (Signed)
Addended byUrsula Alert on: 02/28/2022 01:09 PM   Modules accepted: Orders

## 2022-02-28 NOTE — Telephone Encounter (Signed)
Called to make mother Santiago Glad aware that medication had been sent

## 2022-02-28 NOTE — Telephone Encounter (Signed)
I have sent prescriptions for Metadate 10 mg and 50 mg to respective pharmacies as noted in his chart.

## 2022-03-01 ENCOUNTER — Telehealth: Payer: Self-pay

## 2022-03-01 NOTE — Telephone Encounter (Signed)
per the pharmacy methylphenidate 10mg  can not be picked up until 1-31

## 2022-03-01 NOTE — Telephone Encounter (Signed)
called pharmacy to see if they had a rx on hold for patient on the methylphenidate cd 50mg . they picked up rx on 02-09-22 and it is too early to fill.

## 2022-03-01 NOTE — Telephone Encounter (Signed)
pt mother states that son needed a refill on the metadate cd 50mg  and it needs to go to the [facility] [place]. and that they needed the methylphenidate 10mg  and have it sent to the [facility] court.

## 2022-03-07 ENCOUNTER — Ambulatory Visit: Payer: Medicare Other | Admitting: Psychiatry

## 2022-03-16 ENCOUNTER — Ambulatory Visit (INDEPENDENT_AMBULATORY_CARE_PROVIDER_SITE_OTHER): Payer: Medicare Other | Admitting: Psychiatry

## 2022-03-16 ENCOUNTER — Encounter: Payer: Self-pay | Admitting: Psychiatry

## 2022-03-16 VITALS — BP 130/77 | HR 69 | Temp 97.6°F | Ht 64.96 in | Wt 144.4 lb

## 2022-03-16 DIAGNOSIS — F39 Unspecified mood [affective] disorder: Secondary | ICD-10-CM

## 2022-03-16 DIAGNOSIS — Z79899 Other long term (current) drug therapy: Secondary | ICD-10-CM

## 2022-03-16 DIAGNOSIS — F902 Attention-deficit hyperactivity disorder, combined type: Secondary | ICD-10-CM | POA: Diagnosis not present

## 2022-03-16 DIAGNOSIS — R7989 Other specified abnormal findings of blood chemistry: Secondary | ICD-10-CM

## 2022-03-16 DIAGNOSIS — F70 Mild intellectual disabilities: Secondary | ICD-10-CM | POA: Diagnosis not present

## 2022-03-16 MED ORDER — TRAZODONE HCL 50 MG PO TABS: 50.0000 mg | ORAL_TABLET | Freq: Every evening | ORAL | 0 refills | Status: DC | PRN

## 2022-03-16 MED ORDER — LAMOTRIGINE 100 MG PO TABS: 100.0000 mg | ORAL_TABLET | Freq: Every day | ORAL | 0 refills | Status: DC

## 2022-03-16 MED ORDER — LORAZEPAM 0.5 MG PO TABS: 0.5000 mg | ORAL_TABLET | Freq: Every day | ORAL | 0 refills | Status: DC | PRN

## 2022-03-16 MED ORDER — ESCITALOPRAM OXALATE 20 MG PO TABS: 20.0000 mg | ORAL_TABLET | Freq: Every day | ORAL | 0 refills | Status: DC

## 2022-03-16 MED ORDER — METHYLPHENIDATE HCL ER (CD) 50 MG PO CPCR: 50.0000 mg | ORAL_CAPSULE | Freq: Every morning | ORAL | 0 refills | Status: DC

## 2022-03-16 MED ORDER — METHYLPHENIDATE HCL 10 MG PO TABS: 10.0000 mg | ORAL_TABLET | Freq: Every day | ORAL | 0 refills | Status: DC

## 2022-03-16 MED ORDER — QUETIAPINE FUMARATE ER 150 MG PO TB24: 150.0000 mg | ORAL_TABLET | Freq: Every day | ORAL | 0 refills | Status: DC

## 2022-03-16 MED ORDER — LAMOTRIGINE 25 MG PO TABS: 25.0000 mg | ORAL_TABLET | Freq: Every day | ORAL | 0 refills | Status: DC

## 2022-03-16 NOTE — Progress Notes (Signed)
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  Patient as well as mother-[name]-legal guardian participated in the evaluation today.  Patient today appeared to be agitated, anxious, restless throughout the session.  Patient however was redirectable.  Patient reports since the past few weeks he has been more anxious and restless.  There has been days when he had agitation episodes at home.  His mother reported that she had to call the mobile crisis team recently on him since she could not get him to calm down.  He is currently following up with this therapist on a regular basis and the therapist has been trying to work with him in developing a plan to to help him to cope with his anxiety symptoms.  He is putting some effort into working on it.  Patient reports work is going well.  He has not had any agitation episodes at work.  Patient reports sleep as overall okay.  He goes to bed at around 10 PM and wakes up at around 6 AM.  Patient denies any suicidality, homicidality or perceptual disturbances.  Currently compliant on medications, denies side effects.  Patient reports he does have upcoming appointment with his neurologist coming up on Monday.  Per mother  they were not able to get an appointment  soon with primary care provider for an evaluation for abnormal thyroid labs.  However does have one scheduled for the end of the month.  Denies any other concerns today.  Visit Diagnosis:    ICD-10-CM   1. Episodic mood disorder (HCC)  F39 lamoTRIgine (LAMICTAL) 100 MG tablet    lamoTRIgine (LAMICTAL) 25 MG tablet    QUEtiapine Fumarate (SEROQUEL XR)  150 MG 24 hr tablet    traZODone (DESYREL) 50 MG tablet    escitalopram (LEXAPRO) 20 MG tablet    LORazepam (ATIVAN) 0.5 MG tablet    Thyroid Panel With TSH    2. Attention deficit hyperactivity disorder (ADHD), combined type  F90.2 methylphenidate (METADATE CD) 50 MG CR capsule    methylphenidate (RITALIN) 10 MG tablet    3. Mild intellectual disability  F70     4. High risk medication use  Z79.899 Thyroid Panel With TSH    5. Abnormal TSH  R79.89 Thyroid Panel With TSH      Past Psychiatric History: Reviewed past psychiatric history from progress note on 03/22/2021.  Also reviewed progress note on 04/07/2021.  Past Medical History:  Past Medical History:  Diagnosis Date   Seizures (HCC)    last seizure was at the age of 5   History reviewed. No pertinent surgical history.  Family Psychiatric History: Reviewed family psychiatric history from progress note on 03/22/2021.  Family History:  Family History  Problem Relation Age of Onset   Mental illness Neg Hx     Social History: Reviewed social history from progress note on 03/22/2021. Social History   Socioeconomic History   Marital status: Single    Spouse name: Not on file   Number of children: Not on file   Years of education: Not on file   Highest education level: Not on file  Occupational History  Not on file  Tobacco Use   Smoking status: Never    Passive exposure: Never   Smokeless tobacco: Not on file  Vaping Use   Vaping Use: Never used  Substance and Sexual Activity   Alcohol use: No   Drug use: Never   Sexual activity: Not Currently  Other Topics Concern   Not on file  Social History Narrative   PATIENT HAS LEGAL GUARDIAN - [name],[name] and Santiago Glad ( parents )    Social Determinants of Health   Financial Resource Strain: Not on file  Food Insecurity: Not on file  Transportation Needs: Not on file  Physical Activity: Not on file  Stress: Not on file  Social Connections: Not on file     Allergies:  Allergies  Allergen Reactions   Bee Pollen Shortness Of Breath    Metabolic Disorder Labs: No results found for: "HGBA1C", "MPG" Lab Results  Component Value Date   PROLACTIN 3.2 (L) 03/25/2021   No results found for: "CHOL", "TRIG", "HDL", "CHOLHDL", "VLDL", "LDLCALC" Lab Results  Component Value Date   TSH 5.730 (H) 01/24/2022   TSH 4.120 08/02/2021    Therapeutic Level Labs: Lab Results  Component Value Date   LITHIUM 0.6 01/24/2022   LITHIUM 0.4 (L) 09/29/2021   Lab Results  Component Value Date   VALPROATE 89 04/20/2021   VALPROATE 35 (L) 03/25/2021   No results found for: "CBMZ"  Current Medications: Current Outpatient Medications  Medication Sig Dispense Refill   lamoTRIgine (LAMICTAL) 100 MG tablet Take 1 tablet (100 mg total) by mouth daily. Take along with 25 mg daily 90 tablet 0   lamoTRIgine (LAMICTAL) 25 MG tablet Take 1 tablet (25 mg total) by mouth daily. Take along with 100 mg daily 90 tablet 0   lithium carbonate 300 MG capsule Take 1-2 capsules (300-600 mg total) by mouth as directed. Take 1 tablet daily AM and 2 tablets daily PM 270 capsule 1   LORazepam (ATIVAN) 0.5 MG tablet Take 1 tablet (0.5 mg total) by mouth daily as needed for anxiety. For severe anxiety ,agitation- please limit use 15 tablet 0   methylphenidate (METADATE CD) 50 MG CR capsule Take 1 capsule (50 mg total) by mouth every morning. 30 capsule 0   methylphenidate (RITALIN) 10 MG tablet Take 1 tablet (10 mg total) by mouth daily after lunch. 30 tablet 0   escitalopram (LEXAPRO) 20 MG tablet Take 1 tablet (20 mg total) by mouth daily. 90 tablet 0   [START ON 04/09/2022] methylphenidate (METADATE CD) 50 MG CR capsule Take 1 capsule (50 mg total) by mouth every morning. 30 capsule 0   [START ON 04/08/2022] methylphenidate (RITALIN) 10 MG tablet Take 1 tablet (10 mg total) by mouth daily after lunch. 30 tablet 0   QUEtiapine Fumarate (SEROQUEL XR) 150 MG 24 hr tablet Take 1  tablet (150 mg total) by mouth at bedtime. Dose increase, change to ER ( Stop Seroquel 100 mg ) 90 tablet 0   traZODone (DESYREL) 50 MG tablet Take 1 tablet (50 mg total) by mouth at bedtime as needed for sleep. 90 tablet 0   No current facility-administered medications for this visit.     Musculoskeletal: Strength & Muscle Tone: within normal limits Gait & Station: normal Patient leans: N/A  Psychiatric Specialty Exam: Review of Systems  Unable to perform ROS: Psychiatric disorder    Blood pressure 130/77, pulse 69, temperature 97.6 F (36.4 C), temperature source Skin, height 5' 4.96" (1.65 m), weight 144  lb 6.4 oz (65.5 kg).Body mass index is 24.06 kg/m.  General Appearance: Casual  Eye Contact:  Fair  Speech:  Normal Rate  Volume:  Normal  Mood:  Angry, Anxious, and Irritable  Affect:  Congruent  Thought Process:  Goal Directed and Descriptions of Associations: Circumstantial  Orientation:  Other:  self, situation  Thought Content: Rumination   Suicidal Thoughts:  No  Homicidal Thoughts:  No  Memory:  Immediate;   Fair Recent;   Fair Remote;   Poor  Judgement:  Other:  limited  Insight:  Shallow  Psychomotor Activity:  Increased and Restlessness  Concentration:  Concentration: limited and Attention Span: Poor  Recall:  Poor  Fund of Knowledge:  limited  Language: Fair  Akathisia:  No  Handed:  Right  AIMS (if indicated): not done  Assets:  Communication Skills Desire for Improvement Housing Social Support Transportation  ADL's:  Intact  Cognition: WNL  Sleep:  Fair   Screenings: [place] Office Visit from 11/01/2021 in [place] Video Visit from 09/13/2021 in [place] Video Visit from 07/26/2021 in [facility] Video Visit from 06/28/2021 in [place] Office Visit from 04/26/2021 in  [facility] Total Score 0 0 0 0 0      [place] Office Visit from 03/16/2022 in [place] Video Visit from 07/26/2021 in [facility]  Total GAD-7 Score 19 6      PHQ2-9    [place] Office Visit from 03/16/2022 in [facility] Video Visit from 07/26/2021 in [facility] Counselor from 06/15/2021 in [facility] Office Visit from 03/22/2021 in [facility]  PHQ-2 Total Score 0 0 0 0  PHQ-9 Total Score 5 -- -- --      [place] Office Visit from 03/16/2022 in [place] Video Visit from 01/03/2022 in [place] Office Visit from 11/01/2021 in [facility] No Risk No Risk No Risk        Assessment and Plan: Zacory Fiola is a 27 year old Caucasian male, single, on disability, has a history of ADHD, episodic mood disorder, intellectual disability lives in [place] with his parents who are his legal guardians, was evaluated in office today.  Patient is currently going through medication changes, Depakote is being tapered down and Lamictal is up titrated, hence likely with worsening mood symptoms, agitation, anxiety, will benefit from further medication management, plan as noted below.  Plan Episodic mood disorder-unstable Increase Lamictal, advised patient to start taking Lamictal 125 mg p.o. daily.  Will continue to uptitrate. Advised to monitor for side effects including Stevens-Johnson syndrome. Taper off Depakote, advised to stop taking Depakote now that Lamictal dosage is being increased. Start lorazepam 0.5 mg as needed for severe agitation  and anxiety only. Continue lithium 300 mg p.o. daily in the morning and 600 mg at bedtime Continue Seroquel extended release 150 mg p.o. nightly  ADHD-stable Metadate CD 50 mg p.o. daily Ritalin 10 mg p.o. daily Reviewed [place] PMP AWARxE  Mild intellectual disability-chronic Continue psychotherapy. Pending group home placement.  High risk medication use-abnormal TSH-will order a thyroid panel-patient with recent abnormal TSH-on  01/24/2022.  This is a patient on lithium which likely also could be affecting his thyroid.  Patient to go to [facility]  Also to coordinate care with primary care provider, patient has upcoming appointment on February 27.  Collateral information obtained from mother as noted above.  Follow-up in clinic in 4 to 5 weeks or sooner in person.   This note was generated in part or whole with voice recognition software. Voice recognition is usually quite accurate but there are transcription errors that can and very often do occur. I apologize for any typographical errors that were not detected and corrected.     Ursula Alert, MD 03/16/2022, 4:58 PM

## 2022-03-20 ENCOUNTER — Other Ambulatory Visit: Payer: Self-pay | Admitting: Psychiatry

## 2022-03-20 DIAGNOSIS — R569 Unspecified convulsions: Secondary | ICD-10-CM | POA: Insufficient documentation

## 2022-03-20 DIAGNOSIS — G479 Sleep disorder, unspecified: Secondary | ICD-10-CM | POA: Insufficient documentation

## 2022-03-21 LAB — THYROID PANEL WITH TSH
Free Thyroxine Index: 1.2 (ref 1.2–4.9)
T3 Uptake Ratio: 24 % (ref 24–39)
T4, Total: 4.8 ug/dL (ref 4.5–12.0)
TSH: 2.78 u[IU]/mL (ref 0.450–4.500)

## 2022-05-08 ENCOUNTER — Other Ambulatory Visit: Payer: Self-pay | Admitting: Psychiatry

## 2022-05-08 DIAGNOSIS — F39 Unspecified mood [affective] disorder: Secondary | ICD-10-CM

## 2022-05-09 ENCOUNTER — Telehealth: Payer: Self-pay

## 2022-05-09 DIAGNOSIS — F902 Attention-deficit hyperactivity disorder, combined type: Secondary | ICD-10-CM

## 2022-05-09 MED ORDER — METHYLPHENIDATE HCL ER (CD) 50 MG PO CPCR
50.0000 mg | ORAL_CAPSULE | ORAL | 0 refills | Status: DC
Start: 2022-05-09 — End: 2022-05-30

## 2022-05-09 NOTE — Addendum Note (Signed)
Addended byUrsula Alert on: 05/09/2022 06:18 PM   Modules accepted: Orders

## 2022-05-09 NOTE — Telephone Encounter (Signed)
I have sent Metadate 50 mg to [facility].

## 2022-05-09 NOTE — Telephone Encounter (Signed)
Fax received from pharmacy requesting a refill for the following medication  Last visit 03/16/22 Next visit 05/30/22   methylphenidate (METADATE CD) 50 MG CR capsule   Pharmacy [facility] Phone: [phone]  Fax: [phone]

## 2022-05-10 NOTE — Telephone Encounter (Signed)
Called patient to inform of medication sent to pharmacy no answer left voicemail for patient to return call to office

## 2022-05-30 ENCOUNTER — Encounter: Payer: Self-pay | Admitting: Psychiatry

## 2022-05-30 ENCOUNTER — Ambulatory Visit (INDEPENDENT_AMBULATORY_CARE_PROVIDER_SITE_OTHER): Payer: Medicare Other | Admitting: Psychiatry

## 2022-05-30 ENCOUNTER — Other Ambulatory Visit: Payer: Self-pay | Admitting: Psychiatry

## 2022-05-30 VITALS — BP 123/73 | HR 76 | Temp 98.5°F | Ht 64.96 in | Wt 145.6 lb

## 2022-05-30 DIAGNOSIS — F902 Attention-deficit hyperactivity disorder, combined type: Secondary | ICD-10-CM | POA: Diagnosis not present

## 2022-05-30 DIAGNOSIS — Z79899 Other long term (current) drug therapy: Secondary | ICD-10-CM

## 2022-05-30 DIAGNOSIS — F39 Unspecified mood [affective] disorder: Secondary | ICD-10-CM | POA: Diagnosis not present

## 2022-05-30 DIAGNOSIS — F70 Mild intellectual disabilities: Secondary | ICD-10-CM | POA: Diagnosis not present

## 2022-05-30 MED ORDER — QUETIAPINE FUMARATE ER 150 MG PO TB24: 150.0000 mg | ORAL_TABLET | Freq: Every day | ORAL | 0 refills | Status: DC

## 2022-05-30 MED ORDER — ESCITALOPRAM OXALATE 20 MG PO TABS: 20.0000 mg | ORAL_TABLET | Freq: Every day | ORAL | 0 refills | Status: DC

## 2022-05-30 MED ORDER — LAMOTRIGINE 25 MG PO TABS
25.0000 mg | ORAL_TABLET | Freq: Every day | ORAL | 0 refills | Status: AC
Start: 2022-05-30 — End: ?

## 2022-05-30 MED ORDER — METHYLPHENIDATE HCL ER (CD) 50 MG PO CPCR
50.0000 mg | ORAL_CAPSULE | ORAL | 0 refills | Status: DC
Start: 2022-06-08 — End: 2022-07-07

## 2022-05-30 MED ORDER — LAMOTRIGINE 100 MG PO TABS
100.0000 mg | ORAL_TABLET | Freq: Every day | ORAL | 0 refills | Status: AC
Start: 2022-05-30 — End: ?

## 2022-05-30 MED ORDER — TRAZODONE HCL 50 MG PO TABS
50.0000 mg | ORAL_TABLET | Freq: Every evening | ORAL | 0 refills | Status: DC | PRN
Start: 2022-05-30 — End: 2022-08-30

## 2022-05-30 MED ORDER — METHYLPHENIDATE HCL 10 MG PO TABS: 10.0000 mg | ORAL_TABLET | Freq: Every day | ORAL | 0 refills | Status: DC

## 2022-05-30 NOTE — Progress Notes (Unsigned)
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  Patient as well as mother- [name], legal guardian participated in the evaluation today.  Patient throughout the session appeared to be hyperverbal although he was calm, cooperative, redirectable.  He did not appear to be agitated or anxious or restless like the last session.  He is currently compliant on medications as prescribed.  Patient reports sleep is overall okay, he does have problems with his mattress and hence they are trying to get a new one.  He does take the Seroquel at night which helps.  He also has trazodone as needed available.  Tolerating medications well.  Patient denies any suicidality, homicidality or perceptual disturbances.  Per mother, patient's neurologist Dr. Malvin Johns would like to continue the Lamictal and Depakote combination and hence put him back on the Depakote and titrated it up.  Agreeable to have Dr. Malvin Johns refilled his medications and manage it since I had recommended to taper off the Depakote and to keep him only on the Lamictal due to drug-drug interaction concerns as well as the fact that he was already on multiple mood stabilizers.  Patient denies any other concerns today.  Visit Diagnosis:    ICD-10-CM   1. Episodic mood disorder  F39 lamoTRIgine (LAMICTAL) 100 MG tablet    lamoTRIgine (LAMICTAL) 25 MG tablet    Lithium level    traZODone (DESYREL) 50 MG tablet    QUEtiapine Fumarate (SEROQUEL XR) 150 MG 24 hr tablet    escitalopram (LEXAPRO) 20 MG tablet    2. Attention deficit hyperactivity disorder (ADHD), combined type  F90.2 methylphenidate  (RITALIN) 10 MG tablet    methylphenidate (METADATE CD) 50 MG CR capsule    3. Mild intellectual disability  F70     4. High risk medication use  Z79.899 Lithium level      Past Psychiatric History: I have reviewed psychiatric history from progress note on 03/22/2021.  I have also reviewed progress note on 04/07/2021.  Past Medical History:  Past Medical History:  Diagnosis Date   Seizures    last seizure was at the age of 5   History reviewed. No pertinent surgical history.  Family Psychiatric History: Reviewed family psychiatric history from progress note on 03/22/2021.  Family History:  Family History  Problem Relation Age of Onset   Mental illness Neg Hx     Social History: Reviewed social history from progress note on 03/22/2021. Social History   Socioeconomic History   Marital status: Single    Spouse name: Not on file   Number of children: Not on file   Years of education: Not on file   Highest education level: Not on file  Occupational History   Not on file  Tobacco Use   Smoking status: Never    Passive exposure: Never   Smokeless tobacco: Not on file  Vaping Use   Vaping Use: Never used  Substance and Sexual Activity   Alcohol use: No   Drug use: Never   Sexual activity: Not Currently  Other Topics Concern   Not on file  Social History Narrative   PATIENT HAS  LEGAL GUARDIAN - [name],[name] and [name] ( parents )    Social Determinants of Health   Financial Resource Strain: Not on file  Food Insecurity: Not on file  Transportation Needs: Not on file  Physical Activity: Not on file  Stress: Not on file  Social Connections: Not on file    Allergies:  Allergies  Allergen Reactions   Bee Pollen Shortness Of Breath    Metabolic Disorder Labs: No results found for: "HGBA1C", "MPG" Lab Results  Component Value Date   PROLACTIN 3.2 (L) 03/25/2021   No results found for: "CHOL", "TRIG", "HDL", "CHOLHDL", "VLDL", "LDLCALC" Lab Results  Component  Value Date   TSH 2.780 03/20/2022   TSH 5.730 (H) 01/24/2022    Therapeutic Level Labs: Lab Results  Component Value Date   LITHIUM 0.6 01/24/2022   LITHIUM 0.4 (L) 09/29/2021   Lab Results  Component Value Date   VALPROATE 89 04/20/2021   VALPROATE 35 (L) 03/25/2021   No results found for: "CBMZ"  Current Medications: Current Outpatient Medications  Medication Sig Dispense Refill   divalproex (DEPAKOTE) 250 MG DR tablet Take 250 mg by mouth 3 (three) times daily.     lithium carbonate 300 MG capsule Take 1 OR2 capsules (300-600 mg total) by mouth as directed. Take 1 capsule daily MORNING and 2 capsules daily EVENING 270 capsule 0   LORazepam (ATIVAN) 0.5 MG tablet Take 1 tablet (0.5 mg total) by mouth daily as needed for anxiety. For severe anxiety ,agitation- please limit use 15 tablet 0   methylphenidate (METADATE CD) 50 MG CR capsule Take 1 capsule (50 mg total) by mouth every morning. 30 capsule 0   methylphenidate (RITALIN) 10 MG tablet Take 1 tablet (10 mg total) by mouth daily after lunch. 30 tablet 0   escitalopram (LEXAPRO) 20 MG tablet Take 1 tablet (20 mg total) by mouth daily. 90 tablet 0   lamoTRIgine (LAMICTAL) 100 MG tablet Take 1 tablet (100 mg total) by mouth daily. Take along with 25 mg daily- THIS MEDICATION IS CURRENTLY BEING PRESCRIBED FOR SEIZURES BY NEUROLOGY 90 tablet 0   lamoTRIgine (LAMICTAL) 25 MG tablet Take 1 tablet (25 mg total) by mouth daily. Take along with 100 mg daily- THIS MEDICATION IS BEING PRESCRIBED BY NEUROLOGY 90 tablet 0   [START ON 06/08/2022] methylphenidate (METADATE CD) 50 MG CR capsule Take 1 capsule (50 mg total) by mouth every morning. 30 capsule 0   [START ON 06/06/2022] methylphenidate (RITALIN) 10 MG tablet Take 1 tablet (10 mg total) by mouth daily after lunch. 30 tablet 0   QUEtiapine Fumarate (SEROQUEL XR) 150 MG 24 hr tablet Take 1 tablet (150 mg total) by mouth at bedtime. Dose increase, change to ER ( Stop Seroquel 100 mg ) 90  tablet 0   traZODone (DESYREL) 50 MG tablet Take 1 tablet (50 mg total) by mouth at bedtime as needed for sleep. 90 tablet 0   No current facility-administered medications for this visit.     Musculoskeletal: Strength & Muscle Tone: within normal limits Gait & Station: normal Patient leans: N/A  Psychiatric Specialty Exam: Review of Systems  Psychiatric/Behavioral: Negative.    All other systems reviewed and are negative.   Blood pressure 123/73, pulse 76, temperature 98.5 F (36.9 C), temperature source Skin, height 5' 4.96" (1.65 m), weight 145 lb 9.6 oz (66 kg).Body mass index is 24.26 kg/m.  General Appearance: Casual  Eye Contact:  Good  Speech:  Clear and Coherent  Volume:  Normal  Mood:  Euthymic  Affect:  Congruent  Thought Process:  Goal Directed and Descriptions of Associations: Intact  Orientation:  Full (Time, Place, and Person)  Thought Content: Logical   Suicidal Thoughts:  No  Homicidal Thoughts:  No  Memory:  Immediate;   Fair Recent;   Fair Remote;   Poor  Judgement:  Other:  Limited  Insight:  Shallow  Psychomotor Activity:  Normal  Concentration:  Concentration: Fair and Attention Span: Fair  Recall:  [employer] of Knowledge: Fair  Language: Fair  Akathisia:  No  Handed:  Right  AIMS (if indicated): done  Assets:  Communication Skills Desire for Improvement Housing Social Support  ADL's:  Intact  Cognition: WNL  Sleep:  Fair   Screenings: Geneticist, molecular Office Visit from 11/01/2021 in [facility] Video Visit from 09/13/2021 in [facility] Video Visit from 07/26/2021 in [facility] Video Visit from 06/28/2021 in [facility] Office Visit from 04/26/2021 in [facility]  AIMS Total Score 0 0 0 0 0      GAD-7    Flowsheet Row Office Visit  from 05/30/2022 in [facility] Office Visit from 03/16/2022 in [facility] Video Visit from 07/26/2021 in [facility]  Total GAD-7 Score PHQ2-9    Flowsheet Row Office Visit from 05/30/2022 in [facility] Office Visit from 03/16/2022 in [facility] Video Visit from 07/26/2021 in [facility] Counselor from 06/15/2021 in [facility] Office Visit from 03/22/2021 in [facility]  PHQ-2 Total Score 0 0 0 0 0  PHQ-9 Total Score 9 5 -- -- --      Flowsheet Row Office Visit from 03/16/2022 in [facility] Video Visit from 01/03/2022 in [facility] Office Visit from 11/01/2021 in [facility]  C-SSRS RISK CATEGORY No Risk No Risk No Risk        Assessment and Plan: Evann Erazo is a 27 year old Caucasian male, single, disability, has a history of ADHD, episodic mood disorder, intellectual disability, lives in [place], currently improving on the current medication regimen.  Plan as noted below.  Plan Episodic mood disorder-improving Continue lithium 300 g p.o. daily in the morning and 600 mg at bedtime Seroquel extended release 150 mg p.o. nightly Lorazepam 0.5 mg as needed for severe agitation only. Patient also on Lamictal and Depakote-currently being managed by neurology-the plan was to taper off of the Depakote due to drug-drug interaction however neurology recommended combining Lamictal and Depakote and staying on this medication.  Mother agrees to reach out to neurologist for refills.  ADHD-stable Metadate CD 15 mg p.o. daily Ritalin 10 mg p.o.  daily Reviewed [place] PMP AWARxE  Mild intellectual disability-chronic Continue psychotherapy Pending group home placement.  High risk medication use-we will order a lithium level.  Provided lab slip. Review most recent labs-BUN/creatinine-04/25/2022-within normal limits. Thyroid panel-03/20/2022-within normal limits.  Collateral information obtained from mother as noted above.   Collaboration of Care: Collaboration of Care: Other I have reviewed notes per Dr. Carlton Adam 05/09/2022-patient continued on Depakote and lamotrigine combination.  Patient/Guardian was advised Release of Information must  be obtained prior to any record release in order to collaborate their care with an outside provider. Patient/Guardian was advised if they have not already done so to contact the registration department to sign all necessary forms in order for [place] to release information regarding their care.   Consent: Patient/Guardian gives verbal consent for treatment and assignment of benefits for services provided during this visit. Patient/Guardian expressed understanding and agreed to proceed.   This note was generated in part or whole with voice recognition software. Voice recognition is usually quite accurate but there are transcription errors that can and very often do occur. I apologize for any typographical errors that were not detected and corrected.    Jomarie Longs, MD 05/30/2022, 1:34 PM

## 2022-05-31 LAB — LITHIUM LEVEL: Lithium Lvl: 0.5 mmol/L (ref 0.5–1.2)

## 2022-07-07 ENCOUNTER — Telehealth: Payer: Self-pay

## 2022-07-07 DIAGNOSIS — F902 Attention-deficit hyperactivity disorder, combined type: Secondary | ICD-10-CM

## 2022-07-07 MED ORDER — METHYLPHENIDATE HCL ER (CD) 50 MG PO CPCR
50.0000 mg | ORAL_CAPSULE | ORAL | 0 refills | Status: DC
Start: 2022-07-08 — End: 2022-08-03

## 2022-07-07 MED ORDER — METHYLPHENIDATE HCL 10 MG PO TABS
10.0000 mg | ORAL_TABLET | Freq: Every day | ORAL | 0 refills | Status: DC
Start: 2022-07-10 — End: 2022-08-03

## 2022-07-07 MED ORDER — METHYLPHENIDATE HCL 10 MG PO TABS: 10.0000 mg | ORAL_TABLET | Freq: Every day | ORAL | 0 refills | Status: DC

## 2022-07-07 MED ORDER — METHYLPHENIDATE HCL ER (CD) 50 MG PO CPCR
50.0000 mg | ORAL_CAPSULE | Freq: Every morning | ORAL | 0 refills | Status: DC
Start: 2022-08-06 — End: 2022-10-05

## 2022-07-07 NOTE — Telephone Encounter (Signed)
Mother of patient called to request a refill for the following medication please advise    methylphenidate (METADATE CD) 50 MG CR capsule   Pharmacy   [facility] [address] [place]), [place] - [address] Phone: [phone]  Fax: [phone]      methylphenidate (RITALIN) 10 MG tablet   Pharmacy   [facility] - [place], [place] - [address] Phone: [phone]  Fax: [phone]

## 2022-07-07 NOTE — Telephone Encounter (Signed)
I have sent Ritalin 10 mg to [facility].  I have send Metadate 50 mg to [facility] pharmacy in [place].

## 2022-07-27 ENCOUNTER — Telehealth: Payer: Self-pay | Admitting: Psychiatry

## 2022-07-27 NOTE — Telephone Encounter (Signed)
Patient's mother called and stated patient's PCP Dr.Potter wants [place] to take over all psych medications for patient-Please advise

## 2022-07-27 NOTE — Telephone Encounter (Signed)
I have copied Dr. Elna Breslow here, so she can provide further guidance on this when she returns. Thanks

## 2022-08-03 ENCOUNTER — Telehealth: Payer: Self-pay

## 2022-08-03 DIAGNOSIS — F902 Attention-deficit hyperactivity disorder, combined type: Secondary | ICD-10-CM

## 2022-08-03 MED ORDER — METHYLPHENIDATE HCL ER (CD) 50 MG PO CPCR
50.0000 mg | ORAL_CAPSULE | ORAL | 0 refills | Status: DC
Start: 2022-08-03 — End: 2022-09-01

## 2022-08-03 MED ORDER — METHYLPHENIDATE HCL 10 MG PO TABS: 10.0000 mg | ORAL_TABLET | Freq: Every day | ORAL | 0 refills | Status: DC

## 2022-08-03 NOTE — Telephone Encounter (Signed)
Mother of patient called to request a refill for the following medication which goes to two separate pharmacy's  methylphenidate (METADATE CD) 50 MG CR capsule  This medication should be sent to  [facility] [address]), [place] - [address] Phone: [phone]  Fax: [phone]       methylphenidate (RITALIN) 10 MG tablet  This medication should be sent to  [facility] - [place],  - [address] Phone: [phone]  Fax: [phone]

## 2022-08-03 NOTE — Telephone Encounter (Signed)
Rx sent 

## 2022-08-07 ENCOUNTER — Other Ambulatory Visit: Payer: Self-pay | Admitting: Psychiatry

## 2022-08-07 DIAGNOSIS — F39 Unspecified mood [affective] disorder: Secondary | ICD-10-CM

## 2022-08-08 NOTE — Telephone Encounter (Signed)
Spoke to mother of patient she voiced understanding and stated that she would contact the neurologist for Depakote treatment

## 2022-08-08 NOTE — Telephone Encounter (Signed)
Called to speak to patients mother karen no answer left voicemail to return call to office

## 2022-08-08 NOTE — Telephone Encounter (Signed)
Noted  

## 2022-08-08 NOTE — Telephone Encounter (Signed)
Patient seeing Dr.Potter , neurology for seizure disorder. Depakote was reastarted by neurology for seizure disorder. I had previously recommended weaning off depakote and uptitration of lamictal for mood disorder.  However patient as well as  legal guardian followed up with neurology who recommended otherwise and had agreed to follow-up with neurology for further recommendations regarding Depakote. Please advice to contact neurology for depakote treatment .

## 2022-08-29 ENCOUNTER — Other Ambulatory Visit: Payer: Self-pay | Admitting: Psychiatry

## 2022-08-29 ENCOUNTER — Ambulatory Visit: Payer: Medicare Other | Admitting: Psychiatry

## 2022-08-29 DIAGNOSIS — F39 Unspecified mood [affective] disorder: Secondary | ICD-10-CM

## 2022-08-30 ENCOUNTER — Telehealth: Payer: Self-pay

## 2022-08-30 DIAGNOSIS — F39 Unspecified mood [affective] disorder: Secondary | ICD-10-CM

## 2022-08-30 MED ORDER — QUETIAPINE FUMARATE ER 150 MG PO TB24
150.0000 mg | ORAL_TABLET | Freq: Every day | ORAL | 0 refills | Status: DC
Start: 2022-08-30 — End: 2022-10-31

## 2022-08-30 MED ORDER — ESCITALOPRAM OXALATE 20 MG PO TABS
20.0000 mg | ORAL_TABLET | Freq: Every day | ORAL | 0 refills | Status: DC
Start: 2022-08-30 — End: 2022-12-04

## 2022-08-30 MED ORDER — TRAZODONE HCL 50 MG PO TABS
50.0000 mg | ORAL_TABLET | Freq: Every evening | ORAL | 0 refills | Status: DC | PRN
Start: 2022-08-30 — End: 2022-12-04

## 2022-08-30 NOTE — Telephone Encounter (Signed)
received fax requesting refills on the lexapro, lamotrigine 100mg  and the 25mg , quetiapine 150mg  and the trazodone 50mg . pt was last seen on 4-23 next appt 8-20

## 2022-08-30 NOTE — Telephone Encounter (Signed)
I have sent all requested medications except for Lamictal which is currently being managed by neurologist.

## 2022-08-31 NOTE — Telephone Encounter (Signed)
 left message notifiying patient

## 2022-09-01 ENCOUNTER — Telehealth: Payer: Self-pay

## 2022-09-01 DIAGNOSIS — F902 Attention-deficit hyperactivity disorder, combined type: Secondary | ICD-10-CM

## 2022-09-01 MED ORDER — METHYLPHENIDATE HCL ER (CD) 50 MG PO CPCR
50.0000 mg | ORAL_CAPSULE | ORAL | 0 refills | Status: DC
Start: 2022-09-01 — End: 2022-10-31

## 2022-09-01 NOTE — Telephone Encounter (Signed)
called and spoke to laura she states they do have the rx for the 10mg  methylphenidate and they will go ahead an process that. but., they did not received the rx for the methylphenidate 50mg  so i could not cancel that rx

## 2022-09-01 NOTE — Telephone Encounter (Signed)
Contacted mother to clarify pharmacy since [facility]. According to mother Metadate  50 mg to go to [facility].  She will check with the pharmacist regarding the 10 mg and will let writer know by Monday.

## 2022-09-01 NOTE — Telephone Encounter (Signed)
she called states that she needs the methylphenidate 50mg  [facility] on [address] and the rx for the methylphenidate 10mg  sent to [facility].

## 2022-09-01 NOTE — Telephone Encounter (Signed)
dr. Elna Breslow the only medication you need to send is the methylphenidate 50mg  cr [facility] [address]

## 2022-09-02 NOTE — Telephone Encounter (Signed)
she states she got a text message that the rx was ready. she plans on pick up later today. if she has any issues she will call back monday

## 2022-09-26 ENCOUNTER — Encounter: Payer: Self-pay | Admitting: Psychiatry

## 2022-09-26 ENCOUNTER — Ambulatory Visit (INDEPENDENT_AMBULATORY_CARE_PROVIDER_SITE_OTHER): Payer: Medicare Other | Admitting: Psychiatry

## 2022-09-26 VITALS — BP 129/78 | HR 65 | Temp 98.9°F | Ht 64.96 in | Wt 151.8 lb

## 2022-09-26 DIAGNOSIS — F70 Mild intellectual disabilities: Secondary | ICD-10-CM | POA: Diagnosis not present

## 2022-09-26 DIAGNOSIS — F902 Attention-deficit hyperactivity disorder, combined type: Secondary | ICD-10-CM

## 2022-09-26 DIAGNOSIS — Z79899 Other long term (current) drug therapy: Secondary | ICD-10-CM | POA: Diagnosis not present

## 2022-09-26 DIAGNOSIS — F39 Unspecified mood [affective] disorder: Secondary | ICD-10-CM | POA: Diagnosis not present

## 2022-09-26 MED ORDER — METHYLPHENIDATE HCL 10 MG PO TABS
10.0000 mg | ORAL_TABLET | Freq: Every day | ORAL | 0 refills | Status: DC
Start: 2022-09-30 — End: 2022-10-31

## 2022-09-26 MED ORDER — LITHIUM CARBONATE 300 MG PO CAPS
300.0000 mg | ORAL_CAPSULE | Freq: Every day | ORAL | Status: DC
Start: 2022-09-26 — End: 2022-10-31

## 2022-09-26 NOTE — Progress Notes (Unsigned)
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  Patient as well as mother-[name], legal guardian participated in the evaluation today.  Patient today although hyperverbal was cooperative and redirectable.  Patient does report his frustration since he was unable to go get ice cream today and instead had to come for a doctor's visit.  Patient reports he is compliant on all his medications as prescribed.  Mother seems to believe patient is managing okay with regards to his mood.  Although she has noticed him to be very restless and fidgety often and wonders whether being on multiple medication regimen could be contributing to side effects.  Mother reports she is worried about him being on a stimulant which she is aware could cause him to be restless and anxious.  However he needs to have something for his attention and focus and hence she is not interested in changing it at this time.   Patient is sleeping okay.  There was a night when he did not take any of his medications, the bedtime ones which included Depakote, Lamictal, Seroquel, trazodone and he slept okay and woke up in a good mood.  Hence patient as well as mother wonders whether medications could be contributing to some of his side effects which includes irritability at times, concentration problems and restlessness.  Patient today appeared to be alert and oriented, denies any suicidality, homicidality or perceptual disturbances.  Denies any other concerns today.  Visit Diagnosis:    ICD-10-CM   1. Episodic mood disorder (HCC)  F39 lithium carbonate 300 MG capsule    2. Attention deficit  hyperactivity disorder (ADHD), combined type  F90.2 methylphenidate (RITALIN) 10 MG tablet    3. Mild intellectual disability  F70       Past Psychiatric History: I have reviewed past psychiatric history from progress note on 03/22/2021.  I have also reviewed progress note on 04/07/2021.  Past Medical History:  Past Medical History:  Diagnosis Date   Seizures (HCC)    last seizure was at the age of 5   History reviewed. No pertinent surgical history.  Family Psychiatric History: I have reviewed family psychiatric history from progress note on 03/22/2021.  Family History:  Family History  Problem Relation Age of Onset   Mental illness Neg Hx     Social History: I have reviewed social history from progress note on 03/22/2021. Social History   Socioeconomic History   Marital status: Single    Spouse name: Not on file   Number of children: Not on file   Years of education: Not on file   Highest education level: Not on file  Occupational History   Not on file  Tobacco Use   Smoking status: Never    Passive exposure: Never   Smokeless tobacco: Not on file  Vaping Use   Vaping status: Never Used  Substance and Sexual Activity   Alcohol use: No   Drug use: Never   Sexual activity: Not Currently  Other Topics Concern   Not on file  Social History Narrative   PATIENT HAS LEGAL GUARDIAN - [name],[name] and [name] ( parents )    Social  Determinants of Health   Financial Resource Strain: Patient Declined (04/25/2022)   Received from [facility], [facility]   Overall Financial Resource Strain (CARDIA)    Difficulty of Paying Living Expenses: Patient declined  Food Insecurity: Patient Declined (04/25/2022)   Received from [facility], [facility]   Hunger Vital Sign    Worried About Running Out of Food in the Last Year: Patient declined    Ran Out of Food in the Last Year: Patient declined  Transportation  Needs: Patient Declined (04/25/2022)   Received from [facility], [facility]   PRAPARE - Transportation    In the past 12 months, has lack of transportation kept you from medical appointments or from getting medications?: Patient declined    Lack of Transportation (Non-Medical): Patient declined  Physical Activity: Not on file  Stress: Not on file  Social Connections: Not on file    Allergies:  Allergies  Allergen Reactions   Bee Pollen Shortness Of Breath    Metabolic Disorder Labs: No results found for: "HGBA1C", "MPG" Lab Results  Component Value Date   PROLACTIN 3.2 (L) 03/25/2021   No results found for: "CHOL", "TRIG", "HDL", "CHOLHDL", "VLDL", "LDLCALC" Lab Results  Component Value Date   TSH 2.780 03/20/2022   TSH 5.730 (H) 01/24/2022    Therapeutic Level Labs: Lab Results  Component Value Date   LITHIUM 0.5 05/30/2022   LITHIUM 0.6 01/24/2022   Lab Results  Component Value Date   VALPROATE 89 04/20/2021   VALPROATE 35 (L) 03/25/2021   No results found for: "CBMZ"  Current Medications: Current Outpatient Medications  Medication Sig Dispense Refill   divalproex (DEPAKOTE) 250 MG DR tablet Take 250 mg by mouth 3 (three) times daily.     escitalopram (LEXAPRO) 20 MG tablet Take 1 tablet (20 mg total) by mouth daily. 90 tablet 0   lamoTRIgine (LAMICTAL) 100 MG tablet Take 1 tablet (100 mg total) by mouth daily. Take along with 25 mg daily- THIS MEDICATION IS CURRENTLY BEING PRESCRIBED FOR SEIZURES BY NEUROLOGY 90 tablet 0   lamoTRIgine (LAMICTAL) 25 MG tablet Take 1 tablet (25 mg total) by mouth daily. Take along with 100 mg daily- THIS MEDICATION IS BEING PRESCRIBED BY NEUROLOGY 90 tablet 0   LORazepam (ATIVAN) 0.5 MG tablet Take 1 tablet (0.5 mg total) by mouth daily as needed for anxiety. For severe anxiety ,agitation- please limit use 15 tablet 0   methylphenidate (METADATE CD) 50 MG CR capsule Take 1 capsule (50 mg total)  by mouth every morning. 30 capsule 0   methylphenidate (METADATE CD) 50 MG CR capsule Take 1 capsule (50 mg total) by mouth every morning. 30 capsule 0   methylphenidate (RITALIN) 10 MG tablet Take 1 tablet (10 mg total) by mouth daily after lunch. 30 tablet 0   QUEtiapine Fumarate (SEROQUEL XR) 150 MG 24 hr tablet Take 1 tablet (150 mg total) by mouth at bedtime. 90 tablet 0   traZODone (DESYREL) 50 MG tablet Take 1 tablet (50 mg total) by mouth at bedtime as needed for sleep. 90 tablet 0   lithium carbonate 300 MG capsule Take 1 capsule (300 mg total) by mouth daily.     [START ON 09/30/2022] methylphenidate (RITALIN) 10 MG tablet Take 1 tablet (10 mg total) by mouth daily after lunch. 30 tablet 0   No current facility-administered medications for this visit.     Musculoskeletal: Strength & Muscle  Tone: within normal limits Gait & Station: normal Patient leans: N/A  Psychiatric Specialty Exam: Review of Systems  Psychiatric/Behavioral:  The patient is nervous/anxious.     Blood pressure 129/78, pulse 65, temperature 98.9 F (37.2 C), temperature source Skin, height 5' 4.96" (1.65 m), weight 151 lb 12.8 oz (68.9 kg).Body mass index is 25.29 kg/m.  General Appearance: Fairly Groomed  Eye Contact:  Fair  Speech:  Clear and Coherent  Volume:  Increased  Mood:  Anxious  Affect:  Congruent  Thought Process:  Goal Directed and Descriptions of Associations: Intact  Orientation:  Other:  Oriented to person place self situation  Thought Content: Logical   Suicidal Thoughts:  No  Homicidal Thoughts:  No  Memory:  Immediate;   Fair Recent;   Fair Remote;   limited  Judgement:  Fair  Insight:  Fair  Psychomotor Activity:  Normal  Concentration:  Concentration: Fair and Attention Span: Fair  Recall:  [employer] of Knowledge: Fair  Language: Fair  Akathisia:  No  Handed:  Right  AIMS (if indicated): done  Assets:  Communication Skills Desire for Improvement Housing Social Support   ADL's:  Intact  Cognition: WNL  Sleep:  Fair   Screenings: AIMS    Flowsheet Row Office Visit from 11/01/2021 in [facility] Video Visit from 09/13/2021 in [facility] Video Visit from 07/26/2021 in [facility] Video Visit from 06/28/2021 in [facility] Office Visit from 04/26/2021 in [facility]  AIMS Total Score 0 0 0 0 0      GAD-7    Flowsheet Row Office Visit from 09/26/2022 in [facility] Office Visit from 05/30/2022 in [facility] Office Visit from 03/16/2022 in [facility] Video Visit from 07/26/2021 in [facility]  Total GAD-7 Score 18 16 19 6       PHQ2-9    Flowsheet Row Office Visit from 09/26/2022 in [facility] Office Visit from 05/30/2022 in [facility] Office Visit from 03/16/2022 in [facility] Video Visit from 07/26/2021 in [facility] Counselor from 06/15/2021 in [facility]  PHQ-2 Total Score 0 0 0 0 0  PHQ-9 Total Score -- 9 5 -- --      Flowsheet Row Office Visit from 05/30/2022 in [facility] Office Visit from 03/16/2022 in [facility] Video Visit from 01/03/2022 in [facility]  C-SSRS RISK CATEGORY No Risk No Risk No Risk        Assessment and Plan: [name] is a 27 year old Caucasian male, single, disability, has a history of ADHD, episodic mood disorder, intellectual disability, lives  in [place], currently on polypharmacy, patient as well as mother interested in tapering off some of the medications, worried about long-term side effects , discussed plan as noted below.  Plan Episodic mood disorder-improving Taper of lithium, we will reduce lithium to 300 mg p.o. daily in the morning Seroquel extended release 150 mg p.o. nightly for now.  Will not make any changes at this time. Patient is also on Lamictal and Depakote-currently being managed by neurology. Lorazepam 0.5 mg as needed for severe agitation only. Trazodone 50 mg at  bedtime as needed. Lexapro 20 mg p.o. daily.  ADHD-stable Metadate CD50 milligram p.o. daily Ritalin 10 mg p.o. daily Discussed Ophelia Charter , legal guardian/mother agrees to Pharmacist, hospital know if she is interested. Reviewed [place] PMP AWARxE  Mild intellectual disability-chronic Continue psychotherapy, patient provided resources in the community.  Collateral information obtained from mother as noted above.   Collaboration of Care: Collaboration of Care: Patient to establish care with therapist.  Patient/Guardian was advised Release of Information must be obtained prior to any record release in order to collaborate their care with an outside provider. Patient/Guardian was advised if they have not already done so to contact the registration department to sign all necessary forms in order for [place] to release information regarding their care.   Consent: Patient/Guardian gives verbal consent for treatment and assignment of benefits for services provided during this visit. Patient/Guardian expressed understanding and agreed to proceed.   I have spent atleast 40- minutes face to face with patient today which includes the time spent for preparing to see the patient ( e.g., review of test, records ), obtaining and to review and separately obtained history , ordering medications and test ,psychoeducation and supportive psychotherapy and care coordination,as well as  documenting clinical information in electronic health record.   This note was generated in part or whole with voice recognition software. Voice recognition is usually quite accurate but there are transcription errors that can and very often do occur. I apologize for any typographical errors that were not detected and corrected.    Jomarie Longs, MD 09/26/2022, 5:36 PM

## 2022-09-26 NOTE — Patient Instructions (Signed)
  [email or web address]  [email or web address]  [email or web address] [facility] [address], [place], [place] [address]   [phone]  [facility] [email or web address] [address] [place], [place] [address]  [phone]  [facility] [email or web address] [address], [place], [place] [address]  [phone]   Family solutions - [phone]  [facility] counseling - [phone]  [facility] - [phone] counseling [phone]  [facility] [phone]   [email or web address] this clinician can offer telehealth and has a sliding scale option  [email or web address] this group also offers sliding scale rates and is based out of [place]  Dr. Liborio Nixon with the [facility] specializes in divorce  [employer] [facility] has interns who offer sliding scale rates and some of the full time clinicians do, as well. You complete their contact form on their website and the referrals coordinator will help to get connected to someone   Medicaid below :  [facility] [address] [place], [place] [address]  [phone]    [employer] [address] [employer] [place], [place] [address]  [phone]    [facility] [address] [place], [place] [address]  [phone]

## 2022-10-05 ENCOUNTER — Telehealth: Payer: Self-pay

## 2022-10-05 ENCOUNTER — Telehealth: Payer: Self-pay | Admitting: Psychiatry

## 2022-10-05 DIAGNOSIS — F902 Attention-deficit hyperactivity disorder, combined type: Secondary | ICD-10-CM

## 2022-10-05 MED ORDER — METHYLPHENIDATE HCL ER (CD) 50 MG PO CPCR
50.0000 mg | ORAL_CAPSULE | Freq: Every morning | ORAL | 0 refills | Status: DC
Start: 2022-10-06 — End: 2022-10-31

## 2022-10-05 NOTE — Telephone Encounter (Signed)
Please cancel script for Ritalin sent out to [facility] per request by patient at recent visit . Once that is completed [facility]. Please let me know

## 2022-10-05 NOTE — Telephone Encounter (Signed)
methylphenidate (RITALIN) 10 MG tablet30 tablet08/24/2024--Sig - Route: Take 1 tablet (10 mg total) by mouth daily after lunch. - OralSent to pharmacy as: methylphenidate (RITALIN) 10 MG tabletEarliest Fill Date: 8/24/2024Notes to Pharmacy: Please fill on or after 08/24/2024E-Prescribing Status: Receipt confirmed by pharmacy (09/26/2022  2:40 PM EDT)    Methylphenidate 10 mg was sent to [facility] per her request recently at her visit at the office.  Not sure why she wants the same dosage sent to [facility] pharmacy again.  I can send the [facility]ate [facility] to [facility].  Please let me know.  Please clarify with patient and let me know.

## 2022-10-05 NOTE — Telephone Encounter (Signed)
she [facility] it is cheaper there.

## 2022-10-05 NOTE — Telephone Encounter (Signed)
I have sent Metadate 50 mg to [facility] pharmacy. A prescription for methylphenidate 10 mg will be sent to [facility] once the prescription which was already sent to [place] is canceled.

## 2022-10-05 NOTE — Telephone Encounter (Signed)
[name] left a message that the methylphenidte 50mg  and the 10mg  [facility]. pt was last seen on 8-20 next appt 9-23

## 2022-10-06 NOTE — Telephone Encounter (Signed)
called to cancel rxs and the pharmacy states that the ritalin 10mg  was picked up yesterday

## 2022-10-06 NOTE — Telephone Encounter (Signed)
Noted  

## 2022-10-06 NOTE — Telephone Encounter (Signed)
Please [facility] and cancel script sent out so I can send new one to [facility] .

## 2022-10-06 NOTE — Telephone Encounter (Signed)
she was also told that one of the medication was sent out yesterday and I told her that she need to call at least 5 days before running out to call for a refill.  But please send the ritalin 10mg  also

## 2022-10-06 NOTE — Telephone Encounter (Signed)
she was called back and told that rx for the 10mg  was picked up yesterday accord to the pharmacy. and that the other methylphenidate was [facility] to [facility] yesterday

## 2022-10-06 NOTE — Telephone Encounter (Signed)
she called back 5 times now. states that only one of the methlyphenidates was sent to the [facility] pharmacy please send the ritalin 10mg  [facility]

## 2022-10-30 ENCOUNTER — Telehealth: Payer: Medicare Other | Admitting: Psychiatry

## 2022-10-31 ENCOUNTER — Telehealth (INDEPENDENT_AMBULATORY_CARE_PROVIDER_SITE_OTHER): Payer: Medicare Other | Admitting: Psychiatry

## 2022-10-31 ENCOUNTER — Encounter: Payer: Self-pay | Admitting: Psychiatry

## 2022-10-31 DIAGNOSIS — F902 Attention-deficit hyperactivity disorder, combined type: Secondary | ICD-10-CM

## 2022-10-31 DIAGNOSIS — F70 Mild intellectual disabilities: Secondary | ICD-10-CM

## 2022-10-31 DIAGNOSIS — F39 Unspecified mood [affective] disorder: Secondary | ICD-10-CM

## 2022-10-31 MED ORDER — METHYLPHENIDATE HCL 10 MG PO TABS
10.0000 mg | ORAL_TABLET | Freq: Every day | ORAL | 0 refills | Status: DC
Start: 2022-12-02 — End: 2023-01-09

## 2022-10-31 MED ORDER — LITHIUM CARBONATE 150 MG PO CAPS: 150.0000 mg | ORAL_CAPSULE | ORAL | 0 refills | Status: DC

## 2022-10-31 MED ORDER — LITHIUM CARBONATE 150 MG PO CAPS
150.0000 mg | ORAL_CAPSULE | Freq: Every day | ORAL | 0 refills | Status: DC
Start: 2022-10-31 — End: 2022-12-04

## 2022-10-31 MED ORDER — METHYLPHENIDATE HCL 10 MG PO TABS
10.0000 mg | ORAL_TABLET | Freq: Every day | ORAL | 0 refills | Status: DC
Start: 2022-11-03 — End: 2022-12-04

## 2022-10-31 MED ORDER — METHYLPHENIDATE HCL ER (CD) 50 MG PO CPCR: 50.0000 mg | ORAL_CAPSULE | ORAL | 0 refills | Status: DC

## 2022-10-31 MED ORDER — QUETIAPINE FUMARATE ER 200 MG PO TB24
200.0000 mg | ORAL_TABLET | Freq: Every day | ORAL | 0 refills | Status: DC
Start: 2022-10-31 — End: 2023-01-09

## 2022-10-31 MED ORDER — METHYLPHENIDATE HCL ER (CD) 50 MG PO CPCR: 50.0000 mg | ORAL_CAPSULE | Freq: Every morning | ORAL | 0 refills | Status: DC

## 2022-10-31 NOTE — Progress Notes (Unsigned)
Virtual Visit via Video Note  I connected with [name] on 10/31/22 at  3:30 PM EDT by a video enabled telemedicine application and verified that I am speaking with the correct person using two identifiers.  Location Provider Location : [facility] Patient Location : Home  Participants: Patient , Provider    I discussed the limitations of evaluation and management by telemedicine and the availability of in person appointments. The patient expressed understanding and agreed to proceed.   I discussed the assessment and treatment plan with the patient. The patient was provided an opportunity to ask questions and all were answered. The patient agreed with the plan and demonstrated an understanding of the instructions.   The patient was advised to call back or seek an in-person evaluation if the symptoms worsen or if the condition fails to improve as anticipated.    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  Patient as well as mother-[name], legal guardian participated in evaluation.  Patient as well as mother reported that patient has been having episodic anxiety, obsessive thoughts, rumination.  He also has had some outburst recently at home.  He continues to have behavioral problems.  He is able to function okay at work.  He is currently on a lower dose of lithium ,mother wonders if that is causing some anxiety and obsessive thoughts.  Mother agreeable to increasing the dosage of Seroquel and weaning of lithium further.  He is currently on polypharmacy and hence lithium was tapered down due to long-term risk of adverse  effects.  Patient does report increased appetite especially at night.  Likely medication induced.  Patient otherwise denies any suicidality, homicidality or perceptual disturbances.  Patient as well as mother agreeable to establish care with the therapist to work on behavioral strategies.  Patient is currently compliant on medications.  Denies any other side effects other than the ones noted above.  Patient denies any other concerns today.  Visit Diagnosis:    ICD-10-CM   1. Episodic mood disorder (HCC)  F39 QUEtiapine (SEROQUEL XR) 200 MG 24 hr tablet    lithium carbonate 150 MG capsule    lithium carbonate 150 MG capsule    2. Attention deficit hyperactivity disorder (ADHD), combined type  F90.2 methylphenidate (RITALIN) 10 MG tablet    methylphenidate (RITALIN) 10 MG tablet    methylphenidate (METADATE CD) 50 MG CR capsule    methylphenidate (METADATE CD) 50 MG CR capsule    3. Mild intellectual disability  F70       Past Psychiatric History: I have reviewed past psychiatric history from progress note on 03/22/2021.  I have also reviewed progress note on 04/07/2021.  Past Medical History:  Past Medical History:  Diagnosis Date   Seizures (HCC)    last seizure was at the age of 5   History reviewed. No pertinent surgical history.  Family Psychiatric History: I have reviewed family psychiatric history from my progress note on 03/22/2021.  Family History:  Family History  Problem Relation Age of Onset   Mental illness Neg Hx     Social History: I have reviewed social history from  progress note on 03/22/2021. Social History   Socioeconomic History   Marital status: Single    Spouse name: Not on file   Number of children: Not on file   Years of education: Not on file   Highest education level: Not on file  Occupational History   Not on file  Tobacco Use   Smoking status: Never    Passive exposure: Never   Smokeless tobacco: Not on file  Vaping Use   Vaping status:  Never Used  Substance and Sexual Activity   Alcohol use: No   Drug use: Never   Sexual activity: Not Currently  Other Topics Concern   Not on file  Social History Narrative   PATIENT HAS LEGAL GUARDIAN - [name],[name] and [name] ( parents )    Social Determinants of Health   Financial Resource Strain: Patient Declined (04/25/2022)   Received from [facility], [facility]   Overall Financial Resource Strain (CARDIA)    Difficulty of Paying Living Expenses: Patient declined  Food Insecurity: Patient Declined (04/25/2022)   Received from [facility], [facility]   Hunger Vital Sign    Worried About Running Out of Food in the Last Year: Patient declined    Ran Out of Food in the Last Year: Patient declined  Transportation Needs: Patient Declined (04/25/2022)   Received from [facility], [facility]   PRAPARE - Transportation    In the past 12 months, has lack of transportation kept you from medical appointments or from getting medications?: Patient declined    Lack of Transportation (Non-Medical): Patient declined  Physical Activity: Not on file  Stress: Not on file  Social Connections: Not on file    Allergies:  Allergies  Allergen Reactions   Bee Pollen Shortness Of Breath   Dust Mite Extract Other (See Comments)    Congestion and occasional itching of eyes.    Metabolic Disorder Labs: No results found for: "HGBA1C", "MPG" Lab Results  Component Value Date   PROLACTIN 3.2 (L) 03/25/2021   No results found for: "CHOL", "TRIG", "HDL", "CHOLHDL", "VLDL", "LDLCALC" Lab Results  Component Value Date   TSH 2.780 03/20/2022   TSH 5.730 (H) 01/24/2022    Therapeutic Level Labs: Lab Results  Component Value Date   LITHIUM 0.5 05/30/2022   LITHIUM 0.6 01/24/2022   Lab Results  Component Value Date   VALPROATE 89 04/20/2021   VALPROATE 35 (L) 03/25/2021   No  results found for: "CBMZ"  Current Medications: Current Outpatient Medications  Medication Sig Dispense Refill   lithium carbonate 150 MG capsule Take 1 capsule (150 mg total) by mouth daily for 15 days. 15 capsule 0   [START ON 11/14/2022] lithium carbonate 150 MG capsule Take 1 capsule (150 mg total) by mouth as directed. Take every other day for 10 doses and stop 10 capsule 0   [START ON 12/02/2022] methylphenidate (RITALIN) 10 MG tablet Take 1 tablet (10 mg total) by mouth daily after lunch. 30 tablet 0   QUEtiapine (SEROQUEL XR) 200 MG 24 hr tablet Take 1 tablet (200 mg total) by mouth at bedtime. 90 tablet 0   divalproex (DEPAKOTE) 250 MG DR tablet Take 250 mg by mouth 3 (three) times daily.     escitalopram (LEXAPRO) 20 MG tablet Take 1 tablet (20 mg total) by mouth daily. 90 tablet 0   lamoTRIgine (LAMICTAL) 100 MG tablet Take 1 tablet (100 mg total) by mouth  daily. Take along with 25 mg daily- THIS MEDICATION IS CURRENTLY BEING PRESCRIBED FOR SEIZURES BY NEUROLOGY 90 tablet 0   lamoTRIgine (LAMICTAL) 25 MG tablet Take 1 tablet (25 mg total) by mouth daily. Take along with 100 mg daily- THIS MEDICATION IS BEING PRESCRIBED BY NEUROLOGY 90 tablet 0   LORazepam (ATIVAN) 0.5 MG tablet Take 1 tablet (0.5 mg total) by mouth daily as needed for anxiety. For severe anxiety ,agitation- please limit use 15 tablet 0   [START ON 11/04/2022] methylphenidate (METADATE CD) 50 MG CR capsule Take 1 capsule (50 mg total) by mouth every morning. 30 capsule 0   [START ON 12/03/2022] methylphenidate (METADATE CD) 50 MG CR capsule Take 1 capsule (50 mg total) by mouth every morning. 30 capsule 0   methylphenidate (RITALIN) 10 MG tablet Take 1 tablet (10 mg total) by mouth daily after lunch. 30 tablet 0   [START ON 11/03/2022] methylphenidate (RITALIN) 10 MG tablet Take 1 tablet (10 mg total) by mouth daily after lunch. 30 tablet 0   traZODone (DESYREL) 50 MG tablet Take 1 tablet (50 mg total) by mouth at bedtime as  needed for sleep. 90 tablet 0   No current facility-administered medications for this visit.     Musculoskeletal: Strength & Muscle Tone:  UTA Gait & Station:  Seated Patient leans: N/A  Psychiatric Specialty Exam: Review of Systems  Psychiatric/Behavioral:  Positive for sleep disturbance. The patient is nervous/anxious.     There were no vitals taken for this visit.There is no height or weight on file to calculate BMI.  General Appearance: Fairly Groomed  Eye Contact:  Fair  Speech:  Clear and Coherent  Volume:  Increased  Mood:  Anxious, mood swings  Affect:  Congruent  Thought Process:  Goal Directed and Descriptions of Associations: Intact  Orientation:  Full (Time, Place, and Person)  Thought Content: Logical   Suicidal Thoughts:  No  Homicidal Thoughts:  No  Memory:  Immediate;   Fair Recent;   Fair Remote;   limited  Judgement:  Fair  Insight:  Fair  Psychomotor Activity:  Normal  Concentration:  Concentration: Fair and Attention Span: Fair  Recall:  [employer] of Knowledge: Fair  Language: Fair  Akathisia:  No  Handed:  Right  AIMS (if indicated): not done  Assets:  Communication Skills Desire for Improvement Housing Social Support  ADL's:  Intact  Cognition: WNL  Sleep:   restless at times   Screenings: AIMS    Flowsheet Row Office Visit from 09/26/2022 in [facility] Office Visit from 11/01/2021 in [facility] Video Visit from 09/13/2021 in [facility] Video Visit from 07/26/2021 in [facility] Video Visit from 06/28/2021 in [facility]  AIMS Total Score 0 0 0 0 0      GAD-7    Flowsheet Row Office Visit from 09/26/2022 in [facility] Office Visit from 05/30/2022 in [facility] Office Visit from 03/16/2022 in [facility] Video Visit from 07/26/2021 in [facility]  Total GAD-7 Score 18 16 19 6       PHQ2-9    Flowsheet Row Office Visit from 09/26/2022 in [facility] Office Visit from 05/30/2022 in [facility] Office Visit from 03/16/2022 in [facility] Video Visit from 07/26/2021 in [facility] Counselor from 06/15/2021 in [facility]  PHQ-2 Total Score 0 0 0 0 0  PHQ-9 Total Score -- 9 5 -- --      Flowsheet Row Video Visit from 10/31/2022 in [facility] Office Visit from 09/26/2022 in [facility] Office Visit from 05/30/2022 in [facility]  C-SSRS RISK CATEGORY No Risk No Risk No Risk        Assessment and Plan: [name] is a 27 year old Caucasian male, single, on disability, has a history of ADHD, episodic mood disorder, 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  Plan as noted below.  Plan Episodic mood disorder-unstable Increase Seroquel extended release to 200 mg p.o. nightly Taper off lithium, patient advised to start taking lithium 150 mg p.o. daily for 15 days, then take 150 mg every other day for another 10 doses and stop. Continue Lamictal and Depakote-currently  being managed by neurology. Trazodone 50 mg p.o. nightly as needed Lexapro 20 mg p.o. daily Lorazepam 0.5 mg as needed for severe agitation only. Patient advised to establish care with therapist.  ADHD-stable Metadate CD 50 mg p.o. daily Ritalin 10 mg p.o. daily Reviewed [place] PMP AWARxE  Mild intellectual disability-chronic Patient advised to establish care with therapist.   Collaboration of Care: Collaboration of Care: Referral or follow-up with counselor/therapist AEB encouraged to establish care with therapist.  Patient/Guardian was advised Release of Information must be obtained prior to any record release in order to collaborate their care with an outside provider. Patient/Guardian was advised if they have not already done so to contact the registration department to sign all necessary forms in order for [place] to release information regarding their care.   Consent: Patient/Guardian gives verbal consent for treatment and assignment of benefits for services provided during this visit. Patient/Guardian expressed understanding and agreed to proceed.   Collateral information obtained from mother as noted above.  This note was generated in part or whole with voice recognition software. Voice recognition is usually quite accurate but there are transcription errors that can and very often do occur. I apologize for any typographical errors that were not detected and corrected.    Jomarie Longs, MD 11/01/2022, 9:37 AM

## 2022-11-07 ENCOUNTER — Ambulatory Visit: Payer: Medicare Other | Admitting: Psychiatry

## 2022-12-04 ENCOUNTER — Telehealth (INDEPENDENT_AMBULATORY_CARE_PROVIDER_SITE_OTHER): Payer: Medicare Other | Admitting: Psychiatry

## 2022-12-04 ENCOUNTER — Encounter: Payer: Self-pay | Admitting: Psychiatry

## 2022-12-04 DIAGNOSIS — F902 Attention-deficit hyperactivity disorder, combined type: Secondary | ICD-10-CM

## 2022-12-04 DIAGNOSIS — F39 Unspecified mood [affective] disorder: Secondary | ICD-10-CM | POA: Diagnosis not present

## 2022-12-04 DIAGNOSIS — F70 Mild intellectual disabilities: Secondary | ICD-10-CM

## 2022-12-04 DIAGNOSIS — Z79899 Other long term (current) drug therapy: Secondary | ICD-10-CM | POA: Diagnosis not present

## 2022-12-04 MED ORDER — METHYLPHENIDATE HCL ER (CD) 50 MG PO CPCR: 50.0000 mg | ORAL_CAPSULE | Freq: Every morning | ORAL | 0 refills | Status: DC

## 2022-12-04 MED ORDER — TRAZODONE HCL 50 MG PO TABS: 50.0000 mg | ORAL_TABLET | Freq: Every evening | ORAL | 0 refills | Status: DC | PRN

## 2022-12-04 MED ORDER — METHYLPHENIDATE HCL 10 MG PO TABS: 10.0000 mg | ORAL_TABLET | Freq: Every day | ORAL | 0 refills | Status: DC

## 2022-12-04 MED ORDER — DIVALPROEX SODIUM 250 MG PO DR TAB: 1000.0000 mg | DELAYED_RELEASE_TABLET | ORAL | 1 refills | Status: DC

## 2022-12-04 MED ORDER — METHYLPHENIDATE HCL ER (CD) 50 MG PO CPCR: 50.0000 mg | ORAL_CAPSULE | ORAL | 0 refills | Status: DC

## 2022-12-04 MED ORDER — ESCITALOPRAM OXALATE 20 MG PO TABS
20.0000 mg | ORAL_TABLET | Freq: Every day | ORAL | 0 refills | Status: DC
Start: 2022-12-04 — End: 2023-03-01

## 2022-12-04 NOTE — Progress Notes (Unsigned)
Virtual Visit via Video Note  I connected with [name] on 12/04/22 at  1:30 PM EDT by a video enabled telemedicine application and verified that I am speaking with the correct person using two identifiers.  Location Provider Location : [facility] Patient Location : Home  Participants: Patient ,Mother, Provider    I discussed the limitations of evaluation and management by telemedicine and the availability of in person appointments. The patient expressed understanding and agreed to proceed.   I discussed the assessment and treatment plan with the patient. The patient was provided an opportunity to ask questions and all were answered. The patient agreed with the plan and demonstrated an understanding of the instructions.   The patient was advised to call back or seek an in-person evaluation if the symptoms worsen or if the condition fails to improve as anticipated.   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  Patient as well as mother-[name]-legal guardian participated in the evaluation.  Patient today appeared to be easily distracted, irritable, tangential with his thought process.  Patient needed redirection throughout the session.  According to mother patient has been having worsening anxiety, mood lability since being off of the lithium.  Patient is currently on multiple other mood stabilizers including Lamictal, Depakote, Seroquel.  Patient when asked about his mood symptoms, irritability, unable to elaborate further.  Patient reports he is overall tolerating his medications well.  He does have some sleep  issues although as per mother sleep overall has been okay.  He does take the Seroquel and trazodone at night.  Trazodone is as needed and when he does take it that does help.  Patient reports work is stressful.  He also has other stressors like Halloween coming up, Thanksgiving coming up and other events.  Patient currently denies any suicidality, homicidality or perceptual disturbances.  Patient continues to be under the care of neurology.  Denies any other concerns today.  Visit Diagnosis:    ICD-10-CM   1. Episodic mood disorder (HCC)  F39 divalproex (DEPAKOTE) 250 MG DR tablet    Valproic acid level    Sodium    Platelet count    Hepatic function panel    escitalopram (LEXAPRO) 20 MG tablet    traZODone (DESYREL) 50 MG tablet    2. Attention deficit hyperactivity disorder (ADHD), combined type  F90.2 methylphenidate (METADATE CD) 50 MG CR capsule    methylphenidate (METADATE CD) 50 MG CR capsule    methylphenidate (RITALIN) 10 MG tablet    3. Mild intellectual disability  F70     4. High risk medication use  Z79.899 Valproic acid level    Sodium    Platelet count    Hepatic function panel      Past Psychiatric History: I have reviewed past psychiatric history from progress note on 03/22/2021.  I have also reviewed progress note on 04/07/2021.  Past Medical History:  Past Medical History:  Diagnosis Date   Seizures (HCC)    last seizure was at the age of 5   History reviewed. No pertinent surgical history.  Family Psychiatric History: I have reviewed  family psychiatric history from progress note on 03/22/2021.  Family History:  Family History  Problem Relation Age of Onset   Mental illness Neg Hx     Social History: I have reviewed social history from progress note on 03/22/2021. Social History   Socioeconomic History   Marital status: Single    Spouse name: Not on file   Number of children: Not on file   Years of education: Not on file   Highest education  level: Not on file  Occupational History   Not on file  Tobacco Use   Smoking status: Never    Passive exposure: Never   Smokeless tobacco: Not on file  Vaping Use   Vaping status: Never Used  Substance and Sexual Activity   Alcohol use: No   Drug use: Never   Sexual activity: Not Currently  Other Topics Concern   Not on file  Social History Narrative   PATIENT HAS LEGAL GUARDIAN - [name],[name] and [name] ( parents )    Social Determinants of Health   Financial Resource Strain: Patient Declined (04/25/2022)   Received from [facility], [facility]   Overall Financial Resource Strain (CARDIA)    Difficulty of Paying Living Expenses: Patient declined  Food Insecurity: Patient Declined (04/25/2022)   Received from [facility], [facility]   Hunger Vital Sign    Worried About Running Out of Food in the Last Year: Patient declined    Ran Out of Food in the Last Year: Patient declined  Transportation Needs: Patient Declined (04/25/2022)   Received from [facility], [facility]   PRAPARE - Transportation    In the past 12 months, has lack of transportation kept you from medical appointments or from getting medications?: Patient declined    Lack of Transportation (Non-Medical): Patient declined  Physical Activity: Not on file  Stress: Not on file  Social Connections: Not on file    Allergies:  Allergies  Allergen Reactions   Bee Pollen Shortness Of Breath   Dust Mite Extract Other (See Comments)    Congestion and occasional itching of eyes.    Metabolic Disorder Labs: No results found for: "HGBA1C", "MPG" Lab Results  Component Value Date   PROLACTIN 3.2 (L) 03/25/2021   No results found for: "CHOL", "TRIG", "HDL", "CHOLHDL", "VLDL", "LDLCALC" Lab Results  Component Value Date   TSH 2.780 03/20/2022   TSH 5.730 (H) 01/24/2022    Therapeutic Level Labs: Lab  Results  Component Value Date   LITHIUM 0.5 05/30/2022   LITHIUM 0.6 01/24/2022   Lab Results  Component Value Date   VALPROATE 89 04/20/2021   VALPROATE 35 (L) 03/25/2021   No results found for: "CBMZ"  Current Medications: Current Outpatient Medications  Medication Sig Dispense Refill   divalproex (DEPAKOTE) 250 MG DR tablet Take 4 tablets (1,000 mg total) by mouth as directed. Take 1 tablet daily at 7 AM , 1 tablet daily at 2 PM  and 2 tablets daily at bedtime. 120 tablet 1   escitalopram (LEXAPRO) 20 MG tablet Take 1 tablet (20 mg total) by mouth daily. 90 tablet 0   lamoTRIgine (LAMICTAL) 100 MG tablet Take 1 tablet (100 mg total) by mouth daily. Take along with 25 mg daily- THIS MEDICATION IS CURRENTLY BEING PRESCRIBED FOR SEIZURES BY NEUROLOGY 90 tablet 0   lamoTRIgine (LAMICTAL) 25 MG tablet Take 1 tablet (25 mg total) by mouth daily. Take along with 100  mg daily- THIS MEDICATION IS BEING PRESCRIBED BY NEUROLOGY 90 tablet 0   LORazepam (ATIVAN) 0.5 MG tablet Take 1 tablet (0.5 mg total) by mouth daily as needed for anxiety. For severe anxiety ,agitation- please limit use 15 tablet 0   methylphenidate (METADATE CD) 50 MG CR capsule Take 1 capsule (50 mg total) by mouth every morning. 30 capsule 0   [START ON 01/02/2023] methylphenidate (METADATE CD) 50 MG CR capsule Take 1 capsule (50 mg total) by mouth every morning. 30 capsule 0   methylphenidate (RITALIN) 10 MG tablet Take 1 tablet (10 mg total) by mouth daily after lunch. 30 tablet 0   methylphenidate (RITALIN) 10 MG tablet Take 1 tablet (10 mg total) by mouth daily after lunch. 30 tablet 0   [START ON 01/02/2023] methylphenidate (RITALIN) 10 MG tablet Take 1 tablet (10 mg total) by mouth daily after lunch. 30 tablet 0   QUEtiapine (SEROQUEL XR) 200 MG 24 hr tablet Take 1 tablet (200 mg total) by mouth at bedtime. 90 tablet 0   traZODone (DESYREL) 50 MG tablet Take 1 tablet (50 mg total) by mouth at bedtime as needed for sleep.  90 tablet 0   No current facility-administered medications for this visit.     Musculoskeletal: Strength & Muscle Tone:  UTA Gait & Station:  Seated Patient leans: N/A  Psychiatric Specialty Exam: Review of Systems  Psychiatric/Behavioral:         Irritable    There were no vitals taken for this visit.There is no height or weight on file to calculate BMI.  General Appearance: Fairly Groomed  Eye Contact:  Fair  Speech:  Clear and Coherent  Volume:  Normal  Mood:  Irritable  Affect:  Congruent  Thought Process:  Goal Directed and Descriptions of Associations: Intact  Orientation:  Full (Time, Place, and Person)  Thought Content: Logical   Suicidal Thoughts:  No  Homicidal Thoughts:  No  Memory:  Immediate;   Fair Recent;   Fair Remote;   Fair  Judgement:  Fair  Insight:  Fair  Psychomotor Activity:  Increased  Concentration:  Concentration: Fair and Attention Span: Fair  Recall:  [employer] of Knowledge: Fair  Language: Fair  Akathisia:  No  Handed:  Right  AIMS (if indicated): not done  Assets:  Desire for Improvement Housing Social Support Talents/Skills Transportation  ADL's:  Intact  Cognition: WNL  Sleep:  Poor   Screenings: Geneticist, molecular Office Visit from 09/26/2022 in [facility] Office Visit from 11/01/2021 in [facility] Video Visit from 09/13/2021 in [facility] Video Visit from 07/26/2021 in [facility] Video Visit from 06/28/2021 in [facility]  AIMS Total Score 0 0 0 0 0      GAD-7    Flowsheet Row Office Visit from 09/26/2022 in [facility] Office Visit from 05/30/2022 in [facility] Office Visit from 03/16/2022 in [facility] Video Visit from 07/26/2021 in [facility]  Total GAD-7 Score 18 16 19 6       PHQ2-9    Flowsheet Row Office Visit from 09/26/2022 in [facility] Office Visit from 05/30/2022 in [facility] Office Visit from 03/16/2022 in [facility] Video Visit  from 07/26/2021 in [facility] Counselor from 06/15/2021 in [facility]  PHQ-2 Total Score 0 0 0 0 0  PHQ-9 Total Score -- 9 5 -- --      Flowsheet Row Video Visit from 10/31/2022 in [facility] Office Visit from 09/26/2022 in [facility] Office Visit from 05/30/2022 in [facility]  C-SSRS RISK CATEGORY No Risk No Risk No Risk        Assessment and Plan: Thierry Rittenberry is a 27 year old Caucasian male, single, on disability, has a history of ADHD, episodic mood disorder, 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  Plan as noted below.  Plan Episodic mood disorder-unstable Seroquel extended release 200 mg p.o. nightly Discontinue lithium-tapered off. Increase Depakote to 1000 mg p.o. daily in divided dosage. Continue Lamictal as prescribed per neurology-currently on 150 mg p.o. daily. Patient as well as mother are aware of drug to drug interaction between Lamictal and Depakote, Depakote can increase adverse side effects of Lamictal. Trazodone 50 mg p.o. nightly as needed Lexapro 20 mg p.o. daily. Lorazepam 0.5 mg as needed for severe anxiety/agitation. Patient was advised to establish care with therapist last visit.  ADHD-stable Metadate CD 50 mg p.o.  daily Ritalin 10 mg p.o. daily Reviewed [place] PMP AWARxE  Mild intellectual disability-chronic Patient to establish care with therapist  High risk medication use-will order Depakote level, sodium, platelet count, LFT.  Patient to go to [facility] in a week after starting the higher dosage of Depakote. Reviewed and discussed Depakote level-04/20/2021-89-therapeutic.  Collateral information obtained from mother as noted above.  Collaboration of Care: Collaboration of Care: Other will coordinate care with neurology-will route today's notes.  Patient currently on a higher dose of Depakote.  Patient/Guardian was advised Release of Information must be obtained prior to any record release in order to collaborate their care with an outside provider. Patient/Guardian was advised if they have not already done so to contact the registration department to sign all necessary forms in order for [place] to release information regarding their care.   Consent: Patient/Guardian gives verbal consent for treatment and assignment of benefits for services provided during this visit. Patient/Guardian expressed understanding and agreed to proceed.   I have spent atleast 40 minutes face to face with patient today which includes the time spent for preparing to see the patient ( e.g., review of test, records ), obtaining and to review and separately obtained history , ordering medications and test ,psychoeducation and supportive psychotherapy and care coordination,as well as documenting clinical information in electronic health record.  This note was generated in part or whole with voice recognition software. Voice recognition is usually quite accurate but there are transcription errors that can and very often do occur. I apologize for any typographical errors that were not detected and corrected.    Jomarie Longs, MD 12/04/2022, 2:11 PM

## 2022-12-11 ENCOUNTER — Other Ambulatory Visit: Payer: Self-pay | Admitting: Psychiatry

## 2022-12-11 ENCOUNTER — Telehealth: Payer: Self-pay | Admitting: Psychiatry

## 2022-12-11 NOTE — Telephone Encounter (Signed)
Please let patient know to let us know 2 to 3 days prior to pick up so that we can send a verbal instruction to the pharmacy-that it is okay to fill a few days early the prescription that is due on 01/02/2023.

## 2022-12-11 NOTE — Telephone Encounter (Signed)
[name] Ritalin 50 mg can be renewed on 01/04/23 but that is Thanksgiving day and [place] will be at the beach beginning [place]30[place]22 till after Thanksgiving. Is it possible to get 5 pills prior to them leaving cause he will run out while at the beach. Please advise and call

## 2022-12-12 LAB — HEPATIC FUNCTION PANEL
ALT: 14 [IU]/L (ref 0–44)
AST: 20 [IU]/L (ref 0–40)
Albumin: 5.1 g/dL (ref 4.3–5.2)
Alkaline Phosphatase: 63 [IU]/L (ref 44–121)
Bilirubin Total: 0.3 mg/dL (ref 0.0–1.2)
Bilirubin, Direct: 0.11 mg/dL (ref 0.00–0.40)
Total Protein: 6.9 g/dL (ref 6.0–8.5)

## 2022-12-12 LAB — PLATELET COUNT: Platelets: 210 10*3/uL (ref 150–450)

## 2022-12-12 LAB — VALPROIC ACID LEVEL: Valproic Acid Lvl: 47 ug/mL — ABNORMAL LOW (ref 50–100)

## 2022-12-12 LAB — SODIUM: Sodium: 143 mmol/L (ref 134–144)

## 2022-12-12 NOTE — Telephone Encounter (Signed)
Pt mother was notified

## 2022-12-27 ENCOUNTER — Telehealth: Payer: Self-pay

## 2022-12-27 DIAGNOSIS — F902 Attention-deficit hyperactivity disorder, combined type: Secondary | ICD-10-CM

## 2022-12-27 MED ORDER — METHYLPHENIDATE HCL ER (CD) 50 MG PO CPCR: 50.0000 mg | ORAL_CAPSULE | Freq: Every morning | ORAL | 0 refills | Status: DC

## 2022-12-27 MED ORDER — METHYLPHENIDATE HCL 10 MG PO TABS: 10.0000 mg | ORAL_TABLET | Freq: Every day | ORAL | 0 refills | Status: DC

## 2022-12-27 NOTE — Telephone Encounter (Signed)
I have sent Ritalin 10 mg and Metadate 50 mg to respective pharmacies with an early fill date as ' TODAY ', please let her know she needs to pick up today.

## 2022-12-27 NOTE — Telephone Encounter (Signed)
Pt mother notified.

## 2022-12-27 NOTE — Telephone Encounter (Signed)
[name] states that they will be leaving out of town tomorrow and will not be back until 11-30th. need you to send in a rx that it is ok to fill today so he will have his medication while they are out of town. pt was last seen on 10-28 next appt 12-3

## 2023-01-09 ENCOUNTER — Encounter: Payer: Self-pay | Admitting: Psychiatry

## 2023-01-09 ENCOUNTER — Telehealth (INDEPENDENT_AMBULATORY_CARE_PROVIDER_SITE_OTHER): Payer: Medicare Other | Admitting: Psychiatry

## 2023-01-09 DIAGNOSIS — F39 Unspecified mood [affective] disorder: Secondary | ICD-10-CM | POA: Diagnosis not present

## 2023-01-09 DIAGNOSIS — F902 Attention-deficit hyperactivity disorder, combined type: Secondary | ICD-10-CM | POA: Diagnosis not present

## 2023-01-09 DIAGNOSIS — Z79899 Other long term (current) drug therapy: Secondary | ICD-10-CM | POA: Diagnosis not present

## 2023-01-09 DIAGNOSIS — F70 Mild intellectual disabilities: Secondary | ICD-10-CM

## 2023-01-09 MED ORDER — METHYLPHENIDATE HCL 10 MG PO TABS: 10.0000 mg | ORAL_TABLET | Freq: Every day | ORAL | 0 refills | Status: DC

## 2023-01-09 MED ORDER — QUETIAPINE FUMARATE ER 200 MG PO TB24: 200.0000 mg | ORAL_TABLET | Freq: Every day | ORAL | 1 refills | Status: DC

## 2023-01-09 MED ORDER — LORAZEPAM 0.5 MG PO TABS: 0.5000 mg | ORAL_TABLET | Freq: Every day | ORAL | 0 refills | Status: DC | PRN

## 2023-01-09 MED ORDER — METHYLPHENIDATE HCL ER (CD) 50 MG PO CPCR: 50.0000 mg | ORAL_CAPSULE | Freq: Every morning | ORAL | 0 refills | Status: DC

## 2023-01-09 NOTE — Progress Notes (Unsigned)
Virtual Visit via Video Note  I connected with [name] on 01/09/23 at 11:30 AM EST by a video enabled telemedicine application and verified that I am speaking with the correct person using two identifiers.  Location Provider Location : [facility] Patient Location : Home  Participants: Patient , Mother ,Provider   I discussed the limitations of evaluation and management by telemedicine and the availability of in person appointments. The patient expressed understanding and agreed to proceed.   I discussed the assessment and treatment plan with the patient. The patient was provided an opportunity to ask questions and all were answered. The patient agreed with the plan and demonstrated an understanding of the instructions.   The patient was advised to call back or seek an in-person evaluation if the symptoms worsen or if the condition fails to improve as anticipated.   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  Patient as well as mother-[name]-legal guardian participated in the evaluation.    The patient, [name], presents with irritability, agitation, a persistent cough and cold symptoms, which he attributes to recent exposure to cold weather during a beach visit. He reports feeling unwell and has been experiencing dizziness, particularly upon waking. He also mentions difficulty sleeping, with episodes of waking up feeling dizzy despite taking prescribed sleep medications.  Lacey's mother, [name], reports that his mood has improved with Depakote, noting more moments of calm and less anxiety, although he still  experiences episodes of agitation. She also mentions that Eugune has been taking over-the-counter cough and congestion medication for his current symptoms.  Arianna's medication regimen includes Depakote 1000mg  daily, Seroquel, Trazodone, and Methylphenidate. He reports taking his medications as prescribed, but expresses frustration with the frequency of lab work required to monitor his Depakote levels.  Vicki's agitation is evident throughout the conversation, with frequent interruptions and expressions of frustration. He also expresses reluctance to seek additional help or therapy for his mood and behavior. Despite his agitation, he denies any suicidal ideation or thoughts of harming others.  In summary, Czar presents with a persistent cough and cold symptoms, difficulty sleeping, and episodes of dizziness. His mood has improved on Depakote, but he continues to experience episodes of agitation. His current medication regimen includes Depakote, Seroquel, Trazodone, and Methylphenidate. He is resistant to additional therapy or help for his mood and behavior.    Visit Diagnosis:    ICD-10-CM   1. Episodic mood disorder (HCC)  F39 QUEtiapine (SEROQUEL XR) 200 MG 24 hr tablet    LORazepam (ATIVAN) 0.5 MG tablet    2. Attention deficit hyperactivity disorder (ADHD), combined type  F90.2 methylphenidate (RITALIN) 10 MG tablet    methylphenidate (METADATE CD) 50 MG CR capsule    3. Mild intellectual disability  F70     4. High risk medication use  Z79.899       Past Psychiatric History: I have reviewed past psychiatric history from progress note on 03/22/2021.  I have also reviewed his progress note on 04/07/2021.  Past Medical History:  Past Medical History:  Diagnosis Date   Seizures (HCC)  last seizure was at the age of 5   History reviewed. No pertinent surgical history.  Family Psychiatric History: I have viewed family psychiatric history from progress note on 03/22/2021.  Family  History:  Family History  Problem Relation Age of Onset   Mental illness Neg Hx     Social History: I have reviewed social history from progress note on 03/22/2021. Social History   Socioeconomic History   Marital status: Single    Spouse name: Not on file   Number of children: Not on file   Years of education: Not on file   Highest education level: Not on file  Occupational History   Not on file  Tobacco Use   Smoking status: Never    Passive exposure: Never   Smokeless tobacco: Not on file  Vaping Use   Vaping status: Never Used  Substance and Sexual Activity   Alcohol use: No   Drug use: Never   Sexual activity: Not Currently  Other Topics Concern   Not on file  Social History Narrative   PATIENT HAS LEGAL GUARDIAN - [name],[name] and [name] ( parents )    Social Determinants of Health   Financial Resource Strain: Patient Declined (04/25/2022)   Received from [facility], [facility]   Overall Financial Resource Strain (CARDIA)    Difficulty of Paying Living Expenses: Patient declined  Food Insecurity: Patient Declined (04/25/2022)   Received from [facility], [facility]   Hunger Vital Sign    Worried About Running Out of Food in the Last Year: Patient declined    Ran Out of Food in the Last Year: Patient declined  Transportation Needs: Patient Declined (04/25/2022)   Received from [facility], [facility]   PRAPARE - Transportation    In the past 12 months, has lack of transportation kept you from medical appointments or from getting medications?: Patient declined    Lack of Transportation (Non-Medical): Patient declined  Physical Activity: Not on file  Stress: Not on file  Social Connections: Not on file    Allergies:  Allergies  Allergen Reactions   Bee Pollen Shortness Of Breath   Dust Mite Extract Other (See Comments)    Congestion and occasional  itching of eyes.    Metabolic Disorder Labs: No results found for: "HGBA1C", "MPG" Lab Results  Component Value Date   PROLACTIN 3.2 (L) 03/25/2021   No results found for: "CHOL", "TRIG", "HDL", "CHOLHDL", "VLDL", "LDLCALC" Lab Results  Component Value Date   TSH 2.780 03/20/2022   TSH 5.730 (H) 01/24/2022    Therapeutic Level Labs: Lab Results  Component Value Date   LITHIUM 0.5 05/30/2022   LITHIUM 0.6 01/24/2022   Lab Results  Component Value Date   VALPROATE 47 (L) 12/11/2022   VALPROATE 89 04/20/2021   No results found for: "CBMZ"  Current Medications: Current Outpatient Medications  Medication Sig Dispense Refill   divalproex (DEPAKOTE) 250 MG DR tablet Take 4 tablets (1,000 mg total) by mouth as directed. Take 1 tablet daily at 7 AM , 1 tablet daily at 2 PM  and 2 tablets daily at bedtime. 120 tablet 1   escitalopram (LEXAPRO) 20 MG tablet Take 1 tablet (20 mg total) by mouth daily. 90 tablet 0   lamoTRIgine (LAMICTAL) 100 MG tablet Take 1 tablet (100 mg total) by mouth daily. Take along with 25 mg daily- THIS MEDICATION IS CURRENTLY BEING PRESCRIBED FOR SEIZURES BY NEUROLOGY  90 tablet 0   lamoTRIgine (LAMICTAL) 25 MG tablet Take 1 tablet (25 mg total) by mouth daily. Take along with 100 mg daily- THIS MEDICATION IS BEING PRESCRIBED BY NEUROLOGY 90 tablet 0   LORazepam (ATIVAN) 0.5 MG tablet Take 1 tablet (0.5 mg total) by mouth daily as needed for anxiety. For severe anxiety ,agitation- please limit use 15 tablet 0   methylphenidate (METADATE CD) 50 MG CR capsule Take 1 capsule (50 mg total) by mouth every morning. 30 capsule 0   [START ON 02/03/2023] methylphenidate (METADATE CD) 50 MG CR capsule Take 1 capsule (50 mg total) by mouth every morning. 30 capsule 0   methylphenidate (RITALIN) 10 MG tablet Take 1 tablet (10 mg total) by mouth daily after lunch. 30 tablet 0   methylphenidate (RITALIN) 10 MG tablet Take 1 tablet (10 mg total) by mouth daily after lunch. 30  tablet 0   [START ON 02/07/2023] methylphenidate (RITALIN) 10 MG tablet Take 1 tablet (10 mg total) by mouth daily after lunch. 30 tablet 0   QUEtiapine (SEROQUEL XR) 200 MG 24 hr tablet Take 1 tablet (200 mg total) by mouth at bedtime. 90 tablet 1   traZODone (DESYREL) 50 MG tablet Take 1 tablet (50 mg total) by mouth at bedtime as needed for sleep. 90 tablet 0   No current facility-administered medications for this visit.     Musculoskeletal: Strength & Muscle Tone:  UTA Gait & Station:  Seated Patient leans: N/A  Psychiatric Specialty Exam: Review of Systems  Unable to perform ROS: Psychiatric disorder    There were no vitals taken for this visit.There is no height or weight on file to calculate BMI.  General Appearance: Fairly Groomed  Eye Contact:  Fair  Speech:  Clear and Coherent  Volume:  Increased  Mood:  Angry and Irritable  Affect:  Congruent  Thought Process:  Goal Directed and Descriptions of Associations: Tangential  Orientation:  Other:  person, situation  Thought Content: Logical   Suicidal Thoughts:  No  Homicidal Thoughts:  No  Memory:  Immediate;   Fair Recent;   Fair Remote;   limited  Judgement:  Poor  Insight:  Shallow  Psychomotor Activity:  Increased  Concentration:  Concentration: Poor and Attention Span: Poor  Recall:  Poor  Fund of Knowledge: Fair  Language: Fair  Akathisia:  No  Handed:  Right  AIMS (if indicated): not done  Assets:  Desire for Improvement Housing Social Support Transportation  ADL's:  Intact  Cognition: WNL  Sleep:  Fair   Screenings: Midwife Visit from 09/26/2022 in [facility] Office Visit from 11/01/2021 in [facility] Video Visit from 09/13/2021 in [facility] Video Visit from 07/26/2021 in [facility] Video Visit from 06/28/2021 in [facility]  AIMS Total Score 0 0 0 0 0      GAD-7    Flowsheet Row Office Visit from 09/26/2022 in [facility] Office Visit from 05/30/2022 in [facility] Office Visit from 03/16/2022 in [facility] Video Visit from 07/26/2021 in [facility]  Total GAD-7 Score 18 16 19 6       PHQ2-9    Flowsheet Row Office Visit from 09/26/2022 in [facility] Office Visit from 05/30/2022 in  [facility] Office Visit from 03/16/2022 in [facility] Video Visit from 07/26/2021 in [facility] Counselor from 06/15/2021 in [facility]  PHQ-2 Total Score 0 0 0 0 0  PHQ-9 Total Score -- 9 5 -- --      Flowsheet Row Video Visit from 01/09/2023 in [facility] Video Visit from 12/04/2022 in [facility] Video Visit from 10/31/2022 in [facility]  C-SSRS RISK CATEGORY No Risk No Risk No Risk        Assessment and Plan: Jhamal Polmanteer is a 27 year old Caucasian male, single, on disability, has a history of ADHD, episodic mood disorder, 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  Discussed plan as noted below.  Plan  Episodic mood disorder-unstable Currently on Depakote 1000 mg daily with improved mood and reduced anxiety. Depakote levels were lower than expected, likely due to timing of  blood draw. Discussed importance of timing blood draw 10-12 hours post-dose. Expressed frustration with repeated lab work and agitation during visit. Therapy for anger management and behavioral strategies recommended. - Continue Depakote 1000 mg p.o. daily - Educate on timing of Depakote blood levels (10-12 hours post-dose) - No immediate need to repeat Depakote levels -Continue Lamictal 125 mg p.o. daily-prescribed by neurology.  Patient as well as mother are aware of drug to drug interaction with Lamictal and Depakote, Depakote increases adverse side effects of Lamictal. - Consider therapy for anger management and behavioral strategies - Continue Seroquel XL 200 mg p.o. nightly. - Continue trazodone 50 mg p.o. nightly as needed - Continue Lexapro 20 mg p.o. daily - Continue lorazepam 0.5 mg as needed for severe anxiety/agitation.[place].  Encouraged to use it as needed for agitation, according to legal guardian he rarely uses it.  ADHD-stable - Continue Metadate CD 50 mg p.o. daily - Continue methylphenidate 10 mg p.o. daily - Reviewed [place] AWARxE  Mild intellectual disability-chronic Patient encouraged to establish care with therapist.  Patient declines.  High risk medication use-have reviewed and discussed Depakote level dated 12/11/2022-47-subtherapeutic.  Patient declines repeating labs again likely lab levels low due to the timing of Depakote blood draw.  Provided education.  Will consider repeating this in the future. Sodium-143-within normal limits Platelet count-210-within normal limits Hepatic function panel-within normal limits.  - Consider primary care follow-up if cough persists   Follow-up - Follow-up appointment on March 06, 2023 at 10:30 AM - Check My Chart to confirm appointment - Cancel appointment 48 hours in advance if unable to attend.  Collaboration of Care: Collaboration of Care: Patient refused AEB patient declines referral for  therapy. Collateral information obtained from mother as noted above. Patient/Guardian was advised Release of Information must be obtained prior to any record release in order to collaborate their care with an outside provider. Patient/Guardian was advised if they have not already done so to contact the registration department to sign all necessary forms in order for [place] to release information regarding their care.   Consent: Patient/Guardian gives verbal consent for treatment and assignment of benefits for services provided during this visit. Patient/Guardian expressed understanding and agreed to proceed.   This note was generated in part or whole with  voice recognition software. Voice recognition is usually quite accurate but there are transcription errors that can and very often do occur. I apologize for any typographical errors that were not detected and corrected.    Jomarie Longs, MD 01/10/2023, 8:52 AM

## 2023-02-22 ENCOUNTER — Telehealth: Payer: Self-pay | Admitting: Psychiatry

## 2023-02-22 ENCOUNTER — Telehealth: Payer: Self-pay

## 2023-02-22 NOTE — Telephone Encounter (Signed)
received fax requesting a 90 day supply of the trazodone and the escitalopram. Pt was last seen on 12-3 next appt 1-28

## 2023-02-22 NOTE — Telephone Encounter (Signed)
Contacted patient, discussed plan.

## 2023-02-22 NOTE — Telephone Encounter (Signed)
Received denial from [facility] for methylphenidate 50 mg in response to prior authorization completed. The preferred drugs are extended release dextroamphetamine, immediate release dextroamphetamine, and  immediate release dexmethylphenidate and Strattera.  Contacted mother to discuss, according to mother patient has 1 more refill available with stable pickup end of this month.  He does have an appointment on January 28 and agrees to discuss further management at that visit.

## 2023-02-22 NOTE — Telephone Encounter (Signed)
received fax that the methylphenidate cd 50mg  was denied it is not on the preferred drug list.

## 2023-02-28 ENCOUNTER — Telehealth: Payer: Self-pay

## 2023-02-28 DIAGNOSIS — F39 Unspecified mood [affective] disorder: Secondary | ICD-10-CM

## 2023-02-28 NOTE — Telephone Encounter (Signed)
Patient is not due for quetiapine which was just sent out on 01/09/2023 with the dosage increase.  The other medications like trazodone, Lexapro and lorazepam, were being sent to [facility].  Will have staff contact to verify the pharmacy with mother/legal guardian.

## 2023-02-28 NOTE — Telephone Encounter (Signed)
val spoke with pt mother and she states to send medication to [facility]

## 2023-02-28 NOTE — Telephone Encounter (Signed)
received fax requesting refills onthe escitalopram, quetiapine, trazodone, and lorazepam.  pt was last seen on 12-3 next appt 1-28

## 2023-03-01 MED ORDER — LORAZEPAM 0.5 MG PO TABS: 0.5000 mg | ORAL_TABLET | Freq: Every day | ORAL | 4 refills | Status: AC | PRN

## 2023-03-01 MED ORDER — ESCITALOPRAM OXALATE 20 MG PO TABS: 20.0000 mg | ORAL_TABLET | Freq: Every day | ORAL | 3 refills | Status: AC

## 2023-03-01 MED ORDER — TRAZODONE HCL 50 MG PO TABS: 50.0000 mg | ORAL_TABLET | Freq: Every evening | ORAL | 3 refills | Status: DC | PRN

## 2023-03-01 NOTE — Telephone Encounter (Signed)
I have sent Lexapro, trazodone, lorazepam to pharmacy at [facility].  Not sure if patient wants Depakote send out to the same pharmacy or not.  Will need to clarify this with patient.

## 2023-03-01 NOTE — Telephone Encounter (Signed)
left message with pt mother that rxs' had been sent to the pharmacy

## 2023-03-06 ENCOUNTER — Encounter: Payer: Self-pay | Admitting: Psychiatry

## 2023-03-06 ENCOUNTER — Ambulatory Visit (INDEPENDENT_AMBULATORY_CARE_PROVIDER_SITE_OTHER): Payer: Medicare Other | Admitting: Psychiatry

## 2023-03-06 VITALS — BP 122/78 | HR 78 | Temp 98.1°F | Ht 65.0 in | Wt 151.8 lb

## 2023-03-06 DIAGNOSIS — F70 Mild intellectual disabilities: Secondary | ICD-10-CM

## 2023-03-06 DIAGNOSIS — F39 Unspecified mood [affective] disorder: Secondary | ICD-10-CM

## 2023-03-06 DIAGNOSIS — F902 Attention-deficit hyperactivity disorder, combined type: Secondary | ICD-10-CM | POA: Diagnosis not present

## 2023-03-06 MED ORDER — METHYLPHENIDATE HCL 10 MG PO TABS: 10.0000 mg | ORAL_TABLET | Freq: Two times a day (BID) | ORAL | 0 refills | Status: DC

## 2023-03-06 NOTE — Progress Notes (Signed)
BH MD OP Progress Note  03/06/2023 12:48 PM [name]  MRN:  [id number]  Chief Complaint:  Chief Complaint  Patient presents with   Follow-up   Depression   Anxiety   Medication Refill   HPI: [name] is a 28 year old male, single, lives in [place], has a history of episodic mood disorder, ADHD, mild intellectual disability, history of seizure disorder was evaluated in office today.  Patient presented along with mother-[name]-legal guardian who provided collateral information.  He experiences mood disturbances, particularly related to anger and stress at work, with difficulty focusing and a reluctance to go out due to work and weather conditions. He describes his mood as improved today, feeling less 'ill and cranky' than usual.  He has sleep disturbances, not sleeping well despite taking his nighttime medication at 9:30 PM. He feels groggy and tired upon waking, which he attributes to the timing of his medication. He usually wakes up between 6:30 and 7:00 AM to prepare for work. He takes Depakote with supper . He also takes trazodone every night for sleep, which may contribute to his grogginess.   He is currently on several medications, including escitalopram, lorazepam, Seroquel, trazodone, and Depakote.  Reports overall compliant.  He expresses frustration with frequent medication changes, which he finds disruptive.  He experiences dizziness and shakiness at times, which his mother attributes to low blood sugar. She advises him to consume a sugary drink like Coke when he feels clammy and shaky, which seems to help.  He describes a recent fall in the bathroom due to slipping on shampoo, but no injuries were reported.  In summary patient's mood symptoms are currently overall managed on the current combination of medications.  Patient however has health insurance coverage problem when it comes to his Metadate.  Agreeable to change the Metadate to twice a day dosage of  methylphenidate/Ritalin.   Visit Diagnosis:    ICD-10-CM   1. Episodic mood disorder (HCC)  F39     2. Attention deficit hyperactivity disorder (ADHD), combined type  F90.2 methylphenidate (RITALIN) 10 MG tablet    3. Mild intellectual disability  F70       Past Psychiatric History: I have reviewed past psychiatric history from progress note on 03/22/2021.  I have also reviewed progress note on 04/07/2021.  Past Medical History:  Past Medical History:  Diagnosis Date   Seizures (HCC)    last seizure was at the age of 5   History reviewed. No pertinent surgical history.  Family Psychiatric History: I have reviewed family psychiatric history from progress note on 03/22/2021.  Family History:  Family History  Problem Relation Age of Onset   Mental illness Neg Hx     Social History: I have reviewed social history from progress note on 03/22/2021. Social History   Socioeconomic History   Marital status: Single    Spouse name: Not on file   Number of children: Not on file   Years of education: Not on file   Highest education level: Not on file  Occupational History   Not on file  Tobacco Use   Smoking status: Never    Passive exposure: Never   Smokeless tobacco: Not on file  Vaping Use   Vaping status: Never Used  Substance and Sexual Activity   Alcohol use: No   Drug use: Never   Sexual activity: Not Currently  Other Topics Concern   Not on file  Social History Narrative   PATIENT HAS LEGAL GUARDIAN - [name],[name] and  [name] ( parents )    Social Drivers of Health   Financial Resource Strain: Patient Declined (04/25/2022)   Received from [facility], [facility]   Overall Financial Resource Strain (CARDIA)    Difficulty of Paying Living Expenses: Patient declined  Food Insecurity: Patient Declined (04/25/2022)   Received from [facility], [facility]   Hunger Vital Sign    Worried About  Running Out of Food in the Last Year: Patient declined    Ran Out of Food in the Last Year: Patient declined  Transportation Needs: Patient Declined (04/25/2022)   Received from [facility], [facility]   PRAPARE - Transportation    In the past 12 months, has lack of transportation kept you from medical appointments or from getting medications?: Patient declined    Lack of Transportation (Non-Medical): Patient declined  Physical Activity: Not on file  Stress: Not on file  Social Connections: Not on file    Allergies:  Allergies  Allergen Reactions   Bee Pollen Shortness Of Breath   Dust Mite Extract Other (See Comments)    Congestion and occasional itching of eyes.    Metabolic Disorder Labs: No results found for: "HGBA1C", "MPG" Lab Results  Component Value Date   PROLACTIN 3.2 (L) 03/25/2021   No results found for: "CHOL", "TRIG", "HDL", "CHOLHDL", "VLDL", "LDLCALC" Lab Results  Component Value Date   TSH 2.780 03/20/2022   TSH 5.730 (H) 01/24/2022    Therapeutic Level Labs: Lab Results  Component Value Date   LITHIUM 0.5 05/30/2022   LITHIUM 0.6 01/24/2022   Lab Results  Component Value Date   VALPROATE 47 (L) 12/11/2022   VALPROATE 89 04/20/2021   No results found for: "CBMZ"  Current Medications: Current Outpatient Medications  Medication Sig Dispense Refill   escitalopram (LEXAPRO) 20 MG tablet Take 1 tablet (20 mg total) by mouth daily. 90 tablet 3   lamoTRIgine (LAMICTAL) 100 MG tablet Take 1 tablet (100 mg total) by mouth daily. Take along with 25 mg daily- THIS MEDICATION IS CURRENTLY BEING PRESCRIBED FOR SEIZURES BY NEUROLOGY 90 tablet 0   lamoTRIgine (LAMICTAL) 25 MG tablet Take 1 tablet (25 mg total) by mouth daily. Take along with 100 mg daily- THIS MEDICATION IS BEING PRESCRIBED BY NEUROLOGY 90 tablet 0   LORazepam (ATIVAN) 0.5 MG tablet Take 1 tablet (0.5 mg total) by mouth daily as needed for anxiety. For severe  anxiety ,agitation- please limit use 15 tablet 4   QUEtiapine (SEROQUEL XR) 200 MG 24 hr tablet Take 1 tablet (200 mg total) by mouth at bedtime. 90 tablet 1   traZODone (DESYREL) 50 MG tablet Take 1 tablet (50 mg total) by mouth at bedtime as needed for sleep. 90 tablet 3   divalproex (DEPAKOTE) 250 MG DR tablet Take 4 tablets (1,000 mg total) by mouth as directed. Take 1 tablet daily at 7 AM , 1 tablet daily at 2 PM  and 2 tablets daily at bedtime. 120 tablet 1   [START ON 03/10/2023] methylphenidate (RITALIN) 10 MG tablet Take 1 tablet (10 mg total) by mouth 2 (two) times daily. Take 1 tablet daily at 7:30 AM and 1 tablet daily at 11:30 AM ,STOP METADATE 50 MG 60 tablet 0   No current facility-administered medications for this visit.     Musculoskeletal: Strength & Muscle Tone: within normal limits Gait & Station: normal Patient leans: N/A  Psychiatric Specialty Exam: Review of  Systems  Psychiatric/Behavioral:  Positive for sleep disturbance. The patient is nervous/anxious.     Blood pressure 122/78, pulse 78, temperature 98.1 F (36.7 C), temperature source Temporal, height 5\' 5"  (1.651 m), weight 151 lb 12.8 oz (68.9 kg), SpO2 100%.Body mass index is 25.26 kg/m.  General Appearance: Fairly Groomed  Eye Contact:  Fair  Speech:  Clear and Coherent  Volume:  Normal  Mood:   Anxious-coping okay  Affect:  Congruent  Thought Process:  Goal Directed and Descriptions of Associations: Intact  Orientation:  Full (Time, Place, and Person)  Thought Content: Logical   Suicidal Thoughts:  No  Homicidal Thoughts:  No  Memory:  Immediate;   Fair Recent;   Fair Remote;   Poor  Judgement:  Fair  Insight:  Shallow  Psychomotor Activity:  Normal  Concentration:  Concentration: Fair and Attention Span: Fair  Recall:  [employer] of Knowledge: Fair  Language: Fair  Akathisia:  No  Handed:  Right  AIMS (if indicated): done  Assets:  Desire for Improvement Social Support Transportation   ADL's:  Intact  Cognition: WNL  Sleep:   Excessive/at times grogginess during the day   Screenings: AIMS    Loss adjuster, chartered Office Visit from 03/06/2023 in [facility] Office Visit from 09/26/2022 in [facility] Office Visit from 11/01/2021 in [facility] Video Visit from 09/13/2021 in [facility] Video Visit from 07/26/2021 in [facility]  AIMS Total Score 0 0 0 0 0      GAD-7    Flowsheet Row Office Visit from 09/26/2022 in [facility] Office Visit from 05/30/2022 in [facility] Office Visit from 03/16/2022 in [facility] Video Visit from 07/26/2021 in [facility]  Total GAD-7 Score 18 16 19 6       PHQ2-9    Flowsheet Row Office Visit from 09/26/2022 in [facility] Office Visit from 05/30/2022 in [facility] Office Visit from 03/16/2022 in [facility] Video Visit from 07/26/2021 in [facility] Counselor from 06/15/2021 in [facility]  PHQ-2 Total Score 0 0 0 0 0  PHQ-9 Total Score -- 9 5 -- --      Flowsheet Row Office Visit from 03/06/2023 in [facility] Video Visit from 01/09/2023 in [facility] Video Visit from 12/04/2022 in [facility]  C-SSRS RISK CATEGORY No Risk No Risk No Risk        Assessment and Plan: Cheyne Boulden is a 28 year old Caucasian male, single, on disability, has a history of ADHD, episodic  mood disorder, intellectual disability likely mild, currently reports mood symptoms as improving although continues to have episodic mood swings.  Patient unable to afford Metadate for ADHD due to health insurance coverage change, discussed assessment and plan as noted below.  Episodic mood disorder-improving On Depakote for mood stabilization. Reports improvement but issues with medication timing affecting sleep. Discussed moving Depakote intake to supper time to improve sleep quality. - Move Depakote 1000 mg daily to supper time (around 5 PM) ( depakote level - 47- 12/11/22) - Continue Lamictal which is also a mood stabilizer as prescribed by neurology. - Continue Seroquel  XR 200 mg at bedtime - Continue Lexapro 20 mg daily - Continue lorazepam 0.5 mg as needed for severe anxiety/agitation only. - Monitor mood and sleep patterns with the new medication schedule - Adjust trazodone 50 mg at bedtime intake to as needed and consider halving the dose on some nights - Advise to go to bed by 9 PM and avoid TV 30-40 minutes before sleep - Consider audiobooks or music instead of TV before bed  Attention-Deficit/Hyperactivity Disorder (ADHD)-stable Currently on Metadate but reports issues with insurance coverage and medication costs. Suggested switching to methylphenidate (Ritalin) due to better insurance coverage and familiarity. Discussed starting with 10 mg twice a day and gradually adjusting the dose based on response. Informed about the risk of heart attack if taken together with Metadate. - Stop Metadate - Start methylphenidate 10 mg twice a day (7:30 AM and 11:30 AM) - Ensure not to take Metadate and methylphenidate together to avoid overdose - Schedule follow-up video appointment in mid-February to assess response to new medication  Mild intellectual disability-chronic Patient was encouraged to establish with a therapist however declined.  We will consider this in future as needed.  Collateral  information obtained from mother who was present in session since patient is a limited historian.   Follow-up - Schedule follow-up video appointment in mid-February to assess response to methylphenidate - Schedule follow-up appointment in March to reassess overall health and medication management.  Collaboration of Care: Collaboration of Care: Patient refused AEB patient refused referral for CBT  Patient/Guardian was advised Release of Information must be obtained prior to any record release in order to collaborate their care with an outside provider. Patient/Guardian was advised if they have not already done so to contact the registration department to sign all necessary forms in order for [place] to release information regarding their care.   Consent: Patient/Guardian gives verbal consent for treatment and assignment of benefits for services provided during this visit. Patient/Guardian expressed understanding and agreed to proceed.   This note was generated in part or whole with voice recognition software. Voice recognition is usually quite accurate but there are transcription errors that can and very often do occur. I apologize for any typographical errors that were not detected and corrected.    Jomarie Longs, MD 03/06/2023, 12:48 PM

## 2023-03-09 ENCOUNTER — Telehealth: Payer: Self-pay | Admitting: Psychiatry

## 2023-03-09 NOTE — Telephone Encounter (Signed)
I have not received any request from pharmacy regarding this prescription.  Will have nurse contact for more information.

## 2023-03-13 NOTE — Telephone Encounter (Signed)
Called mother to clarify what information the pharmacy needs from provider she was not sure called [facility] spoke to [place] she stated that she did not see any notes or faxes that had been sent to the office

## 2023-03-13 NOTE — Telephone Encounter (Signed)
So was the patient able to get seroquel xr 200 mg filled which was sent out in December ?

## 2023-03-14 NOTE — Telephone Encounter (Signed)
 Noted and thank you

## 2023-03-14 NOTE — Telephone Encounter (Signed)
Yes when I spoke to his mother she said that he has all of his medications and his next refill for the seroquel is in march

## 2023-04-03 ENCOUNTER — Encounter: Payer: Self-pay | Admitting: Psychiatry

## 2023-04-03 ENCOUNTER — Telehealth (INDEPENDENT_AMBULATORY_CARE_PROVIDER_SITE_OTHER): Payer: Medicare Other | Admitting: Psychiatry

## 2023-04-03 DIAGNOSIS — F70 Mild intellectual disabilities: Secondary | ICD-10-CM

## 2023-04-03 DIAGNOSIS — F902 Attention-deficit hyperactivity disorder, combined type: Secondary | ICD-10-CM | POA: Diagnosis not present

## 2023-04-03 DIAGNOSIS — Z9189 Other specified personal risk factors, not elsewhere classified: Secondary | ICD-10-CM | POA: Diagnosis not present

## 2023-04-03 DIAGNOSIS — F39 Unspecified mood [affective] disorder: Secondary | ICD-10-CM

## 2023-04-03 MED ORDER — TRAZODONE HCL 50 MG PO TABS: 50.0000 mg | ORAL_TABLET | Freq: Every evening | ORAL | Status: DC | PRN

## 2023-04-03 MED ORDER — METHYLPHENIDATE HCL 10 MG PO TABS: 30.0000 mg | ORAL_TABLET | ORAL | 0 refills | Status: DC

## 2023-04-03 MED ORDER — DIVALPROEX SODIUM 250 MG PO DR TAB
1000.0000 mg | DELAYED_RELEASE_TABLET | ORAL | 2 refills | Status: DC
Start: 2023-04-03 — End: 2023-06-26

## 2023-04-03 NOTE — Telephone Encounter (Signed)
 Spoke to patients mother made her aware that she needs to [facility] to [facility] for patient gave her the number she voiced understanding and will schedule patient

## 2023-04-03 NOTE — Progress Notes (Unsigned)
 Virtual Visit via Video Note  I connected with [name] on 04/03/23 at  9:00 AM EST by a video enabled telemedicine application and verified that I am speaking with the correct person using two identifiers.  Location Provider Location : [facility] Patient Location : Home  Participants: Patient , Mother,Provider   I discussed the limitations of evaluation and management by telemedicine and the availability of in person appointments. The patient expressed understanding and agreed to proceed.   I discussed the assessment and treatment plan with the patient. The patient was provided an opportunity to ask questions and all were answered. The patient agreed with the plan and demonstrated an understanding of the instructions.   The patient was advised to call back or seek an in-person evaluation if the symptoms worsen or if the condition fails to improve as anticipated.   BH MD OP Progress Note  04/03/2023 11:31 AM [name]  MRN:  [id number]  Chief Complaint:  Chief Complaint  Patient presents with   Follow-up   Anxiety   ADD   Medication Refill   HPI: [name] is a 28 year old male, single, lives in [place], has a history of episodic mood disorder, ADHD, mild intellectual disability, history of seizure disorder was evaluated by telemedicine today.  Patient presented along with mother-[name] who provided collateral information.  Patient is a limited historian.  His mother notes that the current dosage of methylphenidate, taken as one pill in the morning, is insufficient. He does not focus or calm down as quickly as he did on his previous medication, Metadate. The effects of the medication do not last as long, and he becomes 'wide open and rude' in the mornings before the medication takes effect. The medication takes longer to kick in and wears off, leading to mood changes and irritability. He takes methylphenidate at approximately 7:30 AM after breakfast, and his  mother notes that he becomes angry and out of sorts when the medication wears off, particularly around 11:00 AM.  He has a history of mood disorder and is currently on Lexapro, Depakote, Seroquel for mood stabilization. He has been experiencing light sensitivity and headaches, which have been more frequent recently. Nortriptyline 10 mg at bedtime was started by her neurologist to address these symptoms. He is currently on several medications including Ritalin, nortriptyline, Lexapro, Seroquel, and Depakote. He takes Depakote 250 mg four times daily, with one tablet at each meal and one at bedtime. There is a concern about the potential cardiac effects of his medications, particularly the combination of Seroquel and nortriptyline, which may require monitoring with an EKG. also there is a concern for drug-drug interactions including serotonin syndrome.  He is currently on polypharmacy.    Patient currently denies any suicidality, homicidality or perceptual disturbances.   Visit Diagnosis:    ICD-10-CM   1. Episodic mood disorder (HCC)  F39 traZODone (DESYREL) 50 MG tablet    divalproex (DEPAKOTE) 250 MG DR tablet    2. Attention deficit hyperactivity disorder (ADHD), combined type  F90.2 methylphenidate (RITALIN) 10 MG tablet    3. Mild intellectual disability  F70       Past Psychiatric History: I have reviewed past psychiatric history from progress note on 03/22/2021.  I have also reviewed progress note on 04/07/2021.  Past Medical History:  Past Medical History:  Diagnosis Date   Seizures (HCC)    last seizure was at the age of 5   History reviewed. No pertinent surgical history.  Family Psychiatric History: I  have reviewed family psychiatric history from progress note on 03/22/2021.  Family History:  Family History  Problem Relation Age of Onset   Mental illness Neg Hx     Social History: I have reviewed social history from progress note on 03/22/2021. Social History    Socioeconomic History   Marital status: Single    Spouse name: Not on file   Number of children: Not on file   Years of education: Not on file   Highest education level: Not on file  Occupational History   Not on file  Tobacco Use   Smoking status: Never    Passive exposure: Never   Smokeless tobacco: Not on file  Vaping Use   Vaping status: Never Used  Substance and Sexual Activity   Alcohol use: No   Drug use: Never   Sexual activity: Not Currently  Other Topics Concern   Not on file  Social History Narrative   PATIENT HAS LEGAL Brady - [name],[name] and [name] ( parents )    Social Drivers of Health   Financial Resource Strain: Patient Declined (04/25/2022)   Received from [facility], [facility]   Overall Financial Resource Strain (CARDIA)    Difficulty of Paying Living Expenses: Patient declined  Food Insecurity: Patient Declined (04/25/2022)   [facility] from [facility], [facility]   Hunger Vital Sign    Worried About Running Out of Food in the Last Year: Patient declined    Ran Out of Food in the Last Year: Patient declined  Transportation Needs: Patient Declined (04/25/2022)   Received from [facility], [facility]   PRAPARE - Transportation    In the past 12 months, has lack of transportation kept you from medical appointments or from getting medications?: Patient declined    Lack of Transportation (Non-Medical): Patient declined  Physical Activity: Not on file  Stress: Not on file  Social Connections: Not on file    Allergies:  Allergies  Allergen Reactions   Bee Pollen Shortness Of Breath   Dust Mite Extract Other (See Comments)    Congestion and occasional itching of eyes.    Metabolic Disorder Labs: No results found for: "HGBA1C", "MPG" Lab Results  Component Value Date   PROLACTIN 3.2 (L) 03/25/2021   No results found for: "CHOL",  "TRIG", "HDL", "CHOLHDL", "VLDL", "LDLCALC" Lab Results  Component Value Date   TSH 2.780 03/20/2022   TSH 5.730 (H) 01/24/2022    Therapeutic Level Labs: Lab Results  Component Value Date   LITHIUM 0.5 05/30/2022   LITHIUM 0.6 01/24/2022   Lab Results  Component Value Date   VALPROATE 47 (L) 12/11/2022   VALPROATE 89 04/20/2021   No results found for: "CBMZ"  Current Medications: Current Outpatient Medications  Medication Sig Dispense Refill   nortriptyline (PAMELOR) 10 MG capsule Take 10 mg by mouth at bedtime.     divalproex (DEPAKOTE) 250 MG DR tablet Take 4 tablets (1,000 mg total) by mouth as directed. Take 1 tablet daily at 7 AM , 1 tablet daily at 2 PM  and 2 tablets daily at bedtime. 120 tablet 2   escitalopram (LEXAPRO) 20 MG tablet Take 1 tablet (20 mg total) by mouth daily. 90 tablet 3   lamoTRIgine (LAMICTAL) 100 MG tablet Take 1 tablet (100 mg total) by mouth daily. Take along with 25 mg daily- THIS MEDICATION IS CURRENTLY BEING PRESCRIBED FOR SEIZURES BY NEUROLOGY 90 tablet 0  lamoTRIgine (LAMICTAL) 25 MG tablet Take 1 tablet (25 mg total) by mouth daily. Take along with 100 mg daily- THIS MEDICATION IS BEING PRESCRIBED BY NEUROLOGY 90 tablet 0   LORazepam (ATIVAN) 0.5 MG tablet Take 1 tablet (0.5 mg total) by mouth daily as needed for anxiety. For severe anxiety ,agitation- please limit use 15 tablet 4   methylphenidate (RITALIN) 10 MG tablet Take 3 tablets (30 mg total) by mouth as directed. Take 2 tablets daily at 7:30 AM and 1 tablet daily at 11:30 AM ,STOP METADATE 50 MG 90 tablet 0   QUEtiapine (SEROQUEL XR) 200 MG 24 hr tablet Take 1 tablet (200 mg total) by mouth at bedtime. 90 tablet 1   traZODone (DESYREL) 50 MG tablet Take 1 tablet (50 mg total) by mouth at bedtime as needed for sleep.     No current facility-administered medications for this visit.     Musculoskeletal: Strength & Muscle Tone:  UTA Gait & Station:  Seated Patient leans:  N/A  Psychiatric Specialty Exam: Review of Systems  Psychiatric/Behavioral:  Positive for decreased concentration. The patient is nervous/anxious.     There were no vitals taken for this visit.There is no height or weight on file to calculate BMI.  General Appearance: Casual  Eye Contact:  Fair  Speech:  Clear and Coherent  Volume:  Normal  Mood:  Anxious and Irritable  Affect:  Congruent  Thought Process:  Goal Directed and Descriptions of Associations: Intact  Orientation:  Full (Time, Place, and Person)  Thought Content: Logical   Suicidal Thoughts:  No  Homicidal Thoughts:  No  Memory:  Immediate;   Fair Recent;   Fair  Judgement:  Fair  Insight:  Shallow  Psychomotor Activity:  Normal  Concentration:  Concentration: Fair and Attention Span: Fair  Recall:  [employer] of Knowledge: Fair  Language: Fair  Akathisia:  No  Handed:  Right  AIMS (if indicated): not done  Assets:  Desire for Improvement Housing Social Support  ADL's:  Intact  Cognition: WNL  Sleep:  Fair   Screenings: Midwife Visit from 03/06/2023 in [facility] Office Visit from 09/26/2022 in [facility] Office Visit from 11/01/2021 in [facility] Video Visit from 09/13/2021 in [facility] Video Visit from 07/26/2021 in [facility]  AIMS Total Score 0 0 0 0 0      GAD-7    Flowsheet Row Office Visit from 09/26/2022 in [facility] Office Visit from 05/30/2022 in [facility] Office Visit from 03/16/2022 in [facility] Video Visit from 07/26/2021 in [facility]  Total GAD-7 Score 18 16 19 6       PHQ2-9    Flowsheet Row Office  Visit from 09/26/2022 in [facility] Office Visit from 05/30/2022 in [facility] Office Visit from 03/16/2022 in [facility] Video Visit from 07/26/2021 in [facility] Counselor from 06/15/2021 in [facility]  PHQ-2 Total Score 0 0 0 0 0  PHQ-9 Total Score -- 9 5 -- --      Flowsheet Row Office Visit from 03/06/2023 in [facility] Video Visit from 01/09/2023 in [facility]   [facility] Video Visit from 12/04/2022 in [facility]  C-SSRS RISK CATEGORY No Risk No Risk No Risk        Assessment and Plan: [name] is a 28 year old Caucasian male, single, on disability, has a history of ADHD, episodic mood disorder, intellectual disability likely mild, discussed assessment and plan as noted below.   Episodic mood Disorder-improving Currently on Lexapro. Addition of Nortriptyline per neurology for light sensitivity and headaches discussed. Explained risks of serotonin syndrome and need for monitoring. Discussed potential cardiac effects of combining nortriptyline with Seroquel, including prolonged QT interval, and need for EKG. - Switch Lexapro to Venlafaxine after trial of Nortriptyline-patient to discuss with neurology. - Monitor for serotonin syndrome - Hold Trazodone if Nortriptyline dosage is increased - Continue Seroquel XR 200 mg at bedtime - Continue Lexapro 20 mg daily for now - Continue Depakote 1000 mg daily currently taking it in divided dosage throughout the day.( Depakote level - 47 - 12/11/22) - Continue Lorazepam 0.5 mg as needed for severe anxiety/agitation. - Patient is also on Lamictal which is a mood stabilizer, prescribed by neurology. - Order EKG to monitor for  prolonged QT interval due to combination of Seroquel and nortriptyline - Monitor for cardiac symptoms such as chest pain and palpitations  ADHD-unstable Experiencing suboptimal control of symptoms with current methylphenidate regimen, including difficulty focusing, increased irritability, and mood changes. Current dose appears insufficient. Discussed increasing dosage to improve symptom control, with potential risks such as increased irritability and side effects. - Increase Ritalin/Methylphenidate to 20 mg in the morning and 10 mg in the afternoon - Reviewed [place] PMP AWARxE  Mild intellectual disability-chronic - Patient was encouraged to establish care with therapist in the past. - Patient has good social support system from mother.  At risk for prolonged QT syndrome-Will order EKG.  Patient to call [phone] to get this done.  Collateral information obtained from mother who was present in session since patient is a limited historian.  Discussed with patient as well as mother that patient is on polypharmacy, discussed drug to drug interaction cardiac effect of these medications including QT prolongation as well as serotonin syndrome risk.  Patient as well as mother to monitor closely.  Discussed with patient as well as mother to let this provider know with any medication changes so that we can readjust medication dosage or change medications as needed to avoid polypharmacy address due to the same.   Collaboration of Care: Collaboration of Care: Other I have reviewed notes per Dr. Malvin Johns neurology dated 03/27/2023-patient was started on nortriptyline with plan to increase it to 20 mg.  Patient/Brady was advised Release of Information must be obtained prior to any record release in order to collaborate their care with an outside provider. Patient/Brady was advised if they have not already done so to contact the registration department to sign all necessary forms in order for [place] to release  information regarding their care.   Consent: Patient/Brady gives verbal consent for treatment and assignment of benefits for services provided during this visit. Patient/Brady expressed understanding and agreed to proceed.   I have spent atleast 40 minutes face to face with patient today which includes the time spent for preparing to see the patient ( e.g., review of test, records ), obtaining and to review and separately obtained history , ordering medications and test ,psychoeducation and supportive psychotherapy and care coordination,as well as documenting clinical information in electronic health record.   Anthony Sibert,  MD 04/03/2023, 11:31 AM

## 2023-04-10 ENCOUNTER — Ambulatory Visit
Admission: RE | Admit: 2023-04-10 | Discharge: 2023-04-10 | Disposition: A | Discharge status: Home / Self Care | Payer: Medicare Other | Source: Ambulatory Visit | Attending: Psychiatry | Admitting: Psychiatry

## 2023-04-10 DIAGNOSIS — Z9189 Other specified personal risk factors, not elsewhere classified: Secondary | ICD-10-CM | POA: Diagnosis present

## 2023-04-11 ENCOUNTER — Telehealth: Payer: Self-pay | Admitting: Psychiatry

## 2023-04-11 NOTE — Telephone Encounter (Signed)
 EKG reviewed dated 04/10/2023.  Normal sinus rhythm.  Okay to continue current medication regimen.

## 2023-04-11 NOTE — Telephone Encounter (Signed)
 notified of the results and to continue medications

## 2023-04-23 ENCOUNTER — Other Ambulatory Visit: Payer: Self-pay | Admitting: Psychiatry

## 2023-04-23 DIAGNOSIS — F39 Unspecified mood [affective] disorder: Secondary | ICD-10-CM

## 2023-05-01 ENCOUNTER — Telehealth: Payer: Self-pay

## 2023-05-01 DIAGNOSIS — F902 Attention-deficit hyperactivity disorder, combined type: Secondary | ICD-10-CM

## 2023-05-01 NOTE — Telephone Encounter (Signed)
 pt mother called states that her and her husband will be on vaccation and that [name] sister is going to watch over him until they come back in april but he needs refills on the lexapro and the lamotrigine 100mg  and the 25mg  sent to the [facility].    But she needs a refill on the methylphenidate sent to the [facility] pharmacy  Pt was last seen on 04-03-23 next appt 05-15-23

## 2023-05-02 MED ORDER — METHYLPHENIDATE HCL 10 MG PO TABS: 30.0000 mg | ORAL_TABLET | ORAL | 0 refills | Status: DC

## 2023-05-02 NOTE — Telephone Encounter (Signed)
 left message notifying about rxs

## 2023-05-02 NOTE — Telephone Encounter (Signed)
 I have sent methylphenidate to [facility] pharmacy.  Lexapro was already sent to [facility] pharmacy with multiple refills.  She has to contact the pharmacy.  Lamictal is being prescribed by neurologist.  Please advise to contact neurologist.

## 2023-05-07 ENCOUNTER — Telehealth: Payer: Self-pay

## 2023-05-07 NOTE — Telephone Encounter (Signed)
 pt mother called states that Shante had not been able to keep anything down since friday. she states that he is vomiting everything back up. she concerned because coran is taking alot of medication and she doesn't know what to do. if to keep trying or wait until he stops vomiting and if so how does he start back on his medications.

## 2023-05-07 NOTE — Telephone Encounter (Signed)
 pt mother notified also told to call pcp about getting something for vomiting. and to try to eat light foods and drink plenty of water or pedi light.

## 2023-05-08 NOTE — Telephone Encounter (Signed)
 left message to see how [name] is. if she was able to get in touch with pcp about the vomiting and if he was able to keep medication down.

## 2023-05-08 NOTE — Telephone Encounter (Signed)
 pt mother called states that Ewart seen his pcp today and they gave him a shot and some disolvable pills for the nausea and vomiting. he still not able to keep pills or food down.

## 2023-05-09 NOTE — Telephone Encounter (Signed)
 called and notified pt mother and she asked how he needs to start over on his medication because since he been sick he has not been able to take any of his medications. so she did want to just start him out on what he was taken

## 2023-05-09 NOTE — Telephone Encounter (Signed)
 She can start him back on all his medications at the current dosage.  However Lamictal cannot be restarted at the current dosage if he has not been taking it for more than 2 days.  He may have to start at a lower dosage and titrated up.  Please advise her to discuss with neurology before restarting Lamictal .  Lamictal 100 mg is being prescribed by neurology.

## 2023-05-09 NOTE — Telephone Encounter (Signed)
 Please let patient's mother know that he may need further treatment for his current GI problems , may be needs to be evaluated in the ED if still unable to keep anything down.

## 2023-05-10 NOTE — Telephone Encounter (Signed)
 Pt mother was notified

## 2023-05-12 ENCOUNTER — Emergency Department
Admission: EM | Admit: 2023-05-12 | Discharge: 2023-05-12 | Disposition: A | Discharge status: Home / Self Care | Attending: Emergency Medicine | Admitting: Emergency Medicine

## 2023-05-12 ENCOUNTER — Other Ambulatory Visit: Payer: Self-pay

## 2023-05-12 DIAGNOSIS — R112 Nausea with vomiting, unspecified: Secondary | ICD-10-CM | POA: Insufficient documentation

## 2023-05-12 HISTORY — DX: Unspecified intellectual disabilities: F79

## 2023-05-12 HISTORY — DX: Attention-deficit hyperactivity disorder, unspecified type: F90.9

## 2023-05-12 LAB — COMPREHENSIVE METABOLIC PANEL WITH GFR
ALT: 14 U/L (ref 0–44)
AST: 19 U/L (ref 15–41)
Albumin: 4.5 g/dL (ref 3.5–5.0)
Alkaline Phosphatase: 61 U/L (ref 38–126)
Anion gap: 12 (ref 5–15)
BUN: 18 mg/dL (ref 6–20)
CO2: 25 mmol/L (ref 22–32)
Calcium: 10 mg/dL (ref 8.9–10.3)
Chloride: 101 mmol/L (ref 98–111)
Creatinine, Ser: 0.77 mg/dL (ref 0.61–1.24)
GFR, Estimated: >60 mL/min (ref >60)
Glucose, Bld: 93 mg/dL (ref 70–99)
Potassium: 4.3 mmol/L (ref 3.5–5.1)
Sodium: 138 mmol/L (ref 135–145)
Total Bilirubin: 1.2 mg/dL (ref 0.0–1.2)
Total Protein: 7.4 g/dL (ref 6.5–8.1)

## 2023-05-12 LAB — LIPASE, BLOOD: Lipase: 62 U/L — ABNORMAL HIGH (ref 11–51)

## 2023-05-12 LAB — CBC
HCT: 53.9 % — ABNORMAL HIGH (ref 39.0–52.0)
Hemoglobin: 19.1 g/dL — ABNORMAL HIGH (ref 13.0–17.0)
MCH: 30.6 pg (ref 26.0–34.0)
MCHC: 35.4 g/dL (ref 30.0–36.0)
MCV: 86.2 fL (ref 80.0–100.0)
Platelets: 234 10*3/uL (ref 150–400)
RBC: 6.25 MIL/uL — ABNORMAL HIGH (ref 4.22–5.81)
RDW: 11.3 % — ABNORMAL LOW (ref 11.5–15.5)
WBC: 6.5 10*3/uL (ref 4.0–10.5)
nRBC: 0 % (ref 0.0–0.2)

## 2023-05-12 MED ORDER — SODIUM CHLORIDE 0.9 % IV BOLUS
500.0000 mL | Freq: Once | INTRAVENOUS | Status: AC
  Administered 2023-05-12: 500 mL via INTRAVENOUS

## 2023-05-12 MED ORDER — METOCLOPRAMIDE HCL 5 MG/ML IJ SOLN
10.0000 mg | Freq: Once | INTRAMUSCULAR | Status: AC
  Administered 2023-05-12: 10 mg via INTRAVENOUS
  Filled 2023-05-12: qty 2

## 2023-05-12 MED ORDER — FAMOTIDINE IN NACL 20-0.9 MG/50ML-% IV SOLN
20.0000 mg | Freq: Once | INTRAVENOUS | Status: AC
  Administered 2023-05-12: 20 mg via INTRAVENOUS
  Filled 2023-05-12: qty 50

## 2023-05-12 MED ORDER — METOCLOPRAMIDE HCL 10 MG PO TABS: 10.0000 mg | ORAL_TABLET | Freq: Three times a day (TID) | ORAL | 0 refills | Status: DC

## 2023-05-12 NOTE — ED Triage Notes (Signed)
 Mother reports patient has been nauseous and vomiting intermittently since 3/28.  Reports no abd pain.  Denies diarrhea, but loose stool last time.  Went to PCP and told had a virus was given phenergan and zofran.

## 2023-05-12 NOTE — ED Provider Notes (Signed)
 [facility] Emergency Department Provider Note     Event Date/Time   First MD Initiated Contact with Patient 05/12/23 1433     (approximate)   History   Emesis   HPI  [name] is a 28 y.o. male with a history of seizure disorder and ADHD, presents to the ED with persistent nausea and vomiting.  Patient had onset of symptoms on 3/28, he presented to his PCP for evaluation of 4 days later.  He was prescribed Zofran and diagnosed with a gastroenteritis, considered to be likely viral in nature.  He presents to the ED denying abdominal pain or persistent diarrhea.  He continues to endorse nausea and vomiting.  He denies any fevers, chills, sweats, chest pain, or shortness of breath.  Physical Exam   Triage Vital Signs: ED Triage Vitals [05/12/23 1135]  Encounter Vitals Group     BP 131/86     Systolic BP Percentile      Diastolic BP Percentile      Pulse Rate 77     Resp 16     Temp 97.9 F (36.6 C)     Temp Source Oral     SpO2 100 %     Weight 151 lb (68.5 kg)     Height 5\' 5"  (1.651 m)     Head Circumference      Peak Flow      Pain Score 0     Pain Loc      Pain Education      Exclude from Growth Chart     Most recent vital signs: Vitals:   05/12/23 1730 05/12/23 1806  BP: (!) 143/87   Pulse: (!) 58   Resp: 20   Temp:  98 F (36.7 C)  SpO2: 98%     General Awake, no distress. NAD HEENT NCAT. PERRL. EOMI. No rhinorrhea. Mucous membranes are moist.  CV:  Good peripheral perfusion. RRR RESP:  Normal effort. CTA ABD:  No distention.  Soft and nontender.  Normoactive bowel sounds.  No rebound, guarding, or rigidity noted.   ED Results / Procedures / Treatments   Labs (all labs ordered are listed, but only abnormal results are displayed) Labs Reviewed  LIPASE, BLOOD - Abnormal; Notable for the following components:      Result Value   Lipase 62 (*)    All other components within normal limits  CBC - Abnormal; Notable for  the following components:   RBC 6.25 (*)    Hemoglobin 19.1 (*)    HCT 53.9 (*)    RDW 11.3 (*)    All other components within normal limits  COMPREHENSIVE METABOLIC PANEL WITH GFR    EKG   RADIOLOGY  No results found.   PROCEDURES:  Critical Care performed: No  Procedures   MEDICATIONS ORDERED IN ED: Medications  sodium chloride 0.9 % bolus 500 mL (0 mLs Intravenous Stopped 05/12/23 1812)  metoCLOPramide (REGLAN) injection 10 mg (10 mg Intravenous Given 05/12/23 1551)  famotidine (PEPCID) IVPB 20 mg premix (0 mg Intravenous Stopped 05/12/23 1630)     IMPRESSION / MDM / ASSESSMENT AND PLAN / ED COURSE  I reviewed the triage vital signs and the nursing notes.                              Differential diagnosis includes, but is not limited to, biliary disease (biliary colic, acute cholecystitis, cholangitis, choledocholithiasis, etc),  intrathoracic causes for epigastric abdominal pain including ACS, gastritis, duodenitis, pancreatitis, small bowel or large bowel obstruction, abdominal aortic aneurysm, hernia, and ulcer(s).   Patient's presentation is most consistent with acute presentation with potential threat to life or bodily function.  Patient's diagnosis is consistent with nausea and vomiting, suspected be of a viral etiology.  Patient with reassuring exam and workup at this time.  No clinical evidence of dehydration or toxic appearance.  Labs overall unremarkable without evidence of leukocytosis or critical anemia.  Patient with mildly elevated lipase which is nonspecific at this time, as he is not endorsing any epigastric discomfort.  Patient will be discharged home with prescriptions for Reglan. Patient is to follow up with his PCP as discussed, as needed or otherwise directed. Patient is given ED precautions to return to the ED for any worsening or new symptoms.  FINAL CLINICAL IMPRESSION(S) / ED DIAGNOSES   Final diagnoses:  Nausea and vomiting, unspecified vomiting  type     Rx / DC Orders   ED Discharge Orders          Ordered    metoCLOPramide (REGLAN) 10 MG tablet  3 times daily with meals        05/12/23 1746             Note:  This document was prepared using Dragon voice recognition software and may include unintentional dictation errors.    Lissa Hoard, PA-C 05/13/23 2122    Janith Lima, MD 05/13/23 [phone]

## 2023-05-12 NOTE — ED Notes (Signed)
 Pt resting in bed in NAD, mother at bedside. Pt denies pain, endorses vomiting about 2 times/day for last week and has seen PCP. Provided urinal.

## 2023-05-12 NOTE — Discharge Instructions (Addendum)
 Your exam and labs overall reassuring at this time and no signs of severe dehydration or infectious process.  You should take the nausea medicine as directed.  Increase your fluid intake and advance her diet as tolerated.  Take the prescription nausea medicine every 8 hours as needed.  Follow-up with your primary provider or return to ED if needed.

## 2023-05-15 ENCOUNTER — Encounter: Payer: Self-pay | Admitting: Psychiatry

## 2023-05-15 ENCOUNTER — Ambulatory Visit (INDEPENDENT_AMBULATORY_CARE_PROVIDER_SITE_OTHER): Payer: Medicare Other | Admitting: Psychiatry

## 2023-05-15 VITALS — BP 118/80 | HR 68 | Temp 98.5°F | Ht 65.0 in | Wt 143.4 lb

## 2023-05-15 DIAGNOSIS — F70 Mild intellectual disabilities: Secondary | ICD-10-CM

## 2023-05-15 DIAGNOSIS — F902 Attention-deficit hyperactivity disorder, combined type: Secondary | ICD-10-CM | POA: Diagnosis not present

## 2023-05-15 DIAGNOSIS — F39 Unspecified mood [affective] disorder: Secondary | ICD-10-CM | POA: Diagnosis not present

## 2023-05-15 NOTE — Progress Notes (Unsigned)
 [facility] MD OP Progress Note  05/15/2023 12:44 PM [name]  MRN:  [id number]  Chief Complaint:  Chief Complaint  Patient presents with   Follow-up   Depression   Anxiety   Medication Refill   Discussed the use of AI scribe software for clinical note transcription with the patient, who gave verbal consent to proceed.  History of Present Illness [name] is a 28 year old Caucasian male, single, lives in [place], has a history of ADHD, episodic mood disorder, mild intellectual disability, history of seizure disorder was evaluated in office today.   He is accompanied by his mother who provided collateral information..  He has been experiencing persistent nausea and vomiting for the past eleven days, starting on May 04, 2023. Initially, he thought it was due to a virus, but the symptoms persisted. He feels lightheaded, which worsens with movement, leading to nausea and vomiting, similar to motion sickness. He has lost fifteen pounds during this period. He initially managed the symptoms at home but later sought medical attention. He received Zofran, which provided some relief, but he continued to vomit at least once daily, particularly in the evenings. On May 12, 2023, he visited the emergency department, where he received fluids and was diagnosed with dehydration. Blood tests showed elevated lipase and red blood cells, attributed to dehydration and vomiting. No stomach pain throughout the illness. He feels lightheaded rather than dizzy, with no spinning sensation. He has been able to keep some food down recently and is drinking water.  He has been prescribed meclizine and Zofran to be taken together 20 minutes before meals, which has helped him keep food down. He has not resumed his medications/psychotropics including Lamictal.  He feels weak and tired, with occasional crying spells, which he attributes to feeling unwell. He has not returned to work yet but plans to attempt a short shift soon.  He has been able to eat small amounts of food, such as chicken pie and chicken noodle soup, without vomiting.  He appeared to be alert, oriented to person place time situation and was able to answer questions appropriately.  Patient as well as mother interested in not restarting all of the psychotropic medications and mother interested in reducing the amount of medications he takes on a daily basis.  Mother would also like to hold the stimulant medications since he struggles with appetite issues and weight loss as recommended by his primary care provider.  Currently denies any suicidality, homicidality or perceptual disturbances.     Visit Diagnosis:    ICD-10-CM   1. Episodic mood disorder (HCC)  F39     2. Attention deficit hyperactivity disorder (ADHD), combined type  F90.2     3. Mild intellectual disability  F70       Past Psychiatric History: I have reviewed past psychiatric history from progress note on 03/22/2021.  I have also reviewed progress note from 04/07/2021.  Past Medical History:  Past Medical History:  Diagnosis Date   ADHD    Intellectual disability    Seizures (HCC)    last seizure was at the age of 5   History reviewed. No pertinent surgical history.  Family Psychiatric History: I have reviewed family psychiatric history from progress note on 03/22/2021.  Family History:  Family History  Problem Relation Age of Onset   Mental illness Neg Hx     Social History: I have reviewed social history from progress note on 03/22/2021. Social History   Socioeconomic History   Marital  status: Single    Spouse name: Not on file   Number of children: Not on file   Years of education: Not on file   Highest education level: Not on file  Occupational History   Not on file  Tobacco Use   Smoking status: Never    Passive exposure: Never   Smokeless tobacco: Not on file  Vaping Use   Vaping status: Never Used  Substance and Sexual Activity   Alcohol use: No    Drug use: Never   Sexual activity: Not Currently  Other Topics Concern   Not on file  Social History Narrative   PATIENT HAS LEGAL GUARDIAN - [name],[name] and [name] ( parents )    Social Drivers of Health   Financial Resource Strain: Patient Declined (05/14/2023)   Received from [facility]   Overall Financial Resource Strain (CARDIA)    Difficulty of Paying Living Expenses: Patient declined  Food Insecurity: Patient Declined (05/14/2023)   [facility] from [facility]   Hunger Vital Sign    Worried About Running Out of Food in the Last Year: Patient declined    Ran Out of Food in the Last Year: Patient declined  Transportation Needs: Patient Declined (05/14/2023)   Received from [facility]   PRAPARE - Transportation    In the past 12 months, has lack of transportation kept you from medical appointments or from getting medications?: Patient declined    Lack of Transportation (Non-Medical): Patient declined  Physical Activity: Not on file  Stress: Not on file  Social Connections: Not on file    Allergies:  Allergies  Allergen Reactions   Bee Pollen Shortness Of Breath   Dust Mite Extract Other (See Comments)    Congestion and occasional itching of eyes.    Metabolic Disorder Labs: No results found for: "HGBA1C", "MPG" Lab Results  Component Value Date   PROLACTIN 3.2 (L) 03/25/2021   No results found for: "CHOL", "TRIG", "HDL", "CHOLHDL", "VLDL", "LDLCALC" Lab Results  Component Value Date   TSH 2.780 03/20/2022   TSH 5.730 (H) 01/24/2022    Therapeutic Level Labs: Lab Results  Component Value Date   LITHIUM 0.5 05/30/2022   LITHIUM 0.6 01/24/2022   Lab Results  Component Value Date   VALPROATE 47 (L) 12/11/2022   VALPROATE 89 04/20/2021   No results found for: "CBMZ"  Current Medications: Current Outpatient Medications  Medication Sig Dispense Refill   meclizine (ANTIVERT) 25 MG tablet Take by mouth.      metoCLOPramide (REGLAN) 10 MG tablet Take 1 tablet (10 mg total) by mouth 3 (three) times daily with meals for 10 days. 30 tablet 0   ondansetron (ZOFRAN-ODT) 4 MG disintegrating tablet Take by mouth.     promethazine-dextromethorphan (PROMETHAZINE-DM) 6.25-15 MG/5ML syrup Take by mouth.     divalproex (DEPAKOTE) 250 MG DR tablet Take 4 tablets (1,000 mg total) by mouth as directed. Take 1 tablet daily at 7 AM , 1 tablet daily at 2 PM  and 2 tablets daily at bedtime. (Patient not taking: Reported on 05/15/2023) 120 tablet 2   escitalopram (LEXAPRO) 20 MG tablet Take 1 tablet (20 mg total) by mouth daily. (Patient not taking: Reported on 05/15/2023) 90 tablet 3   lamoTRIgine (LAMICTAL) 100 MG tablet Take 1 tablet (100 mg total) by mouth daily. Take along with 25 mg daily- THIS MEDICATION IS CURRENTLY BEING PRESCRIBED FOR SEIZURES BY NEUROLOGY (Patient not taking: Reported on 05/15/2023) 90 tablet 0  lamoTRIgine (LAMICTAL) 25 MG tablet Take 1 tablet (25 mg total) by mouth daily. Take along with 100 mg daily- THIS MEDICATION IS BEING PRESCRIBED BY NEUROLOGY (Patient not taking: Reported on 05/15/2023) 90 tablet 0   LORazepam (ATIVAN) 0.5 MG tablet Take 1 tablet (0.5 mg total) by mouth daily as needed for anxiety. For severe anxiety ,agitation- please limit use (Patient not taking: Reported on 05/15/2023) 15 tablet 4   methylphenidate (RITALIN) 10 MG tablet Take 3 tablets (30 mg total) by mouth as directed. Take 2 tablets daily at 7:30 AM and 1 tablet daily at 11:30 AM ,STOP METADATE 50 MG (Patient not taking: Reported on 05/15/2023) 90 tablet 0   nortriptyline (PAMELOR) 10 MG capsule Take 10 mg by mouth at bedtime. (Patient not taking: Reported on 05/15/2023)     QUEtiapine (SEROQUEL XR) 200 MG 24 hr tablet Take 1 tablet (200 mg total) by mouth at bedtime. (Patient not taking: Reported on 05/15/2023) 90 tablet 0   traZODone (DESYREL) 50 MG tablet Take 1 tablet (50 mg total) by mouth at bedtime as needed for sleep. (Patient  not taking: Reported on 05/15/2023)     No current facility-administered medications for this visit.     Musculoskeletal: Strength & Muscle Tone: within normal limits Gait & Station: normal Patient leans: N/A  Psychiatric Specialty Exam: Review of Systems  Psychiatric/Behavioral: Negative.      Blood pressure 118/80, pulse 68, temperature 98.5 F (36.9 C), temperature source Temporal, height 5\' 5"  (1.651 m), weight 143 lb 6.4 oz (65 kg), SpO2 96%.Body mass index is 23.86 kg/m.  General Appearance: Fairly Groomed  Eye Contact:  Fair  Speech:  Normal Rate  Volume:  Normal  Mood:  Euthymic occasional crying due to feeling tired and weak from his recent illness however currently denies it.  Affect:  Appropriate  Thought Process:  Goal Directed and Descriptions of Associations: Intact  Orientation:  Full (Time, Place, and Person)  Thought Content: Logical   Suicidal Thoughts:  No  Homicidal Thoughts:  No  Memory:    Judgement:  Fair  Insight:  Fair  Psychomotor Activity:  Normal  Concentration:  Concentration: Fair and Attention Span: Fair  Recall:  [employer] of Knowledge: Fair  Language: Fair  Akathisia:  No  Handed:  Right  AIMS (if indicated): done  Assets:  Desire for Improvement Housing Social Support  ADL's:  Intact  Cognition: WNL  Sleep:  Fair   Screenings: Midwife Visit from 05/15/2023 in [facility] Office Visit from 03/06/2023 in [facility] Office Visit from 09/26/2022 in [facility] Office Visit from 11/01/2021 in [facility] Video Visit from 09/13/2021 in [facility]  AIMS Total Score 0 0 0 0 0      GAD-7    Flowsheet Row Office Visit from 09/26/2022 in [facility] Office Visit from 05/30/2022 in  [facility] Office Visit from 03/16/2022 in [facility] Video Visit from 07/26/2021 in [facility]  Total GAD-7 Score 18 16 19 6       PHQ2-9    Flowsheet Row Office Visit from 09/26/2022 in [facility] Office Visit from 05/30/2022 in [facility] Office Visit from 03/16/2022 in [facility] Video Visit from 07/26/2021 in [facility] Counselor from 06/15/2021 in [facility]  PHQ-2 Total Score 0 0 0 0 0  PHQ-9 Total Score -- 9 5 -- --      Flowsheet Row Office Visit from 05/15/2023 in [facility] ED from 05/12/2023 in [facility] Emergency Department at [facility] Video Visit from 04/03/2023 in [facility]  C-SSRS RISK CATEGORY No Risk No Risk No Risk        Assessment and Plan: [name] is a 28 year old Caucasian male, single, on disability, has a history of ADHD, episodic mood disorder, intellectual disability likely mild, was evaluated in office today.  Assessment & Plan Episodic mood disorder-improving  Lamictal was stopped due to illness, and there is consideration of not restarting it. Depakote may be causing light sensitivity. Emotional lability is likely exacerbated by illness and medication changes. There is a discussion about the potential to discontinue Depakote due to its side effects and the possibility of reintroducing Lamictal gradually if needed. He is advised to consult with neurology regarding the reintroduction of Lamictal, starting at 25 mg and titrating up if needed. The necessity of continuing Depakote versus Lamictal is to be evaluated, considering side effects and  interactions. Nortriptyline is to be held off if not effective for light sensitivity, pending neurologist's advice. - Consider restarting Lexapro and Depakote gradually. - Consult with neurology regarding the reintroduction of Lamictal, starting at 25 mg and titrating up if needed. - Evaluate the necessity of continuing Depakote versus Lamictal, considering side effects and interactions. - Hold off on Nortriptyline if not effective for light sensitivity, pending neurologist's advice. - Restart Seroquel XR 200 mg at bedtime in a week from now. - Continue Trazodone 50 mg at bedtime as needed - Continue Lorazepam 0.5 mg as needed for severe anxiety/agitation.  ADHD-unstable Currently recovering from his GI problems and is holding the methylphenidate.  Will consider readjusting dosage in the future. - Hold Methylphenidate 20 mg in the morning and 10 mg in the afternoon for now until GI problems improved.  Mild intellectual disability-chronic Patient has good support system, will continue to reevaluate in future sessions.  Collateral information obtained from mother as well as reviewed medical records from most recent emergency department visit dated 05/12/2023-patient was treated for dehydration.   Follow-up Ongoing monitoring of recovery from gastrointestinal illness and medication adjustments is necessary. A video follow-up appointment is scheduled in three weeks to assess progress and medication regimen. He is advised to contact the provider via MyChart for any medication refills or concerns. - Schedule a video follow-up appointment in three weeks to assess progress and medication regimen. - Contact the provider via MyChart for any medication refills or concerns.     Collaboration of Care: Collaboration of Care: Primary Care Provider AEB encouraged to continue to follow up with primary care provider and neurology for support.  Patient/Guardian was advised Release of Information must be  obtained prior to any record release in order to collaborate their care with an outside provider. Patient/Guardian was advised if they have not already done so to contact the registration department to sign all necessary forms in order for [place] to release information regarding their care.   Consent: Patient/Guardian gives verbal consent for treatment and assignment of benefits for services provided during this visit. Patient/Guardian expressed understanding and agreed to proceed.  This note was generated in part or whole with  voice recognition software. Voice recognition is usually quite accurate but there are transcription errors that can and very often do occur. I apologize for any typographical errors that were not detected and corrected.     Jomarie Longs, MD 05/16/2023, 2:30 PM

## 2023-06-11 ENCOUNTER — Telehealth: Payer: Self-pay

## 2023-06-11 DIAGNOSIS — F902 Attention-deficit hyperactivity disorder, combined type: Secondary | ICD-10-CM

## 2023-06-11 MED ORDER — METHYLPHENIDATE HCL 10 MG PO TABS: 30.0000 mg | ORAL_TABLET | ORAL | 0 refills | Status: DC

## 2023-06-11 NOTE — Telephone Encounter (Signed)
 pt mother left a message that pt needs a refill on the methylphenidate  and the trazodone . pt was last seen on 4-8 net appt 5-20

## 2023-06-11 NOTE — Telephone Encounter (Signed)
 I have sent methylphenidate  to [facility] pharmacy.  Trazodone  is not due for refill.  He should have refills left on his previous prescription bottle.  It was previously sent out to [facility] pharmacy.  Please let patient know.

## 2023-06-12 NOTE — Telephone Encounter (Signed)
left message with information

## 2023-06-21 DIAGNOSIS — E781 Pure hyperglyceridemia: Secondary | ICD-10-CM | POA: Insufficient documentation

## 2023-06-26 ENCOUNTER — Encounter: Payer: Self-pay | Admitting: Psychiatry

## 2023-06-26 ENCOUNTER — Telehealth (INDEPENDENT_AMBULATORY_CARE_PROVIDER_SITE_OTHER): Admitting: Psychiatry

## 2023-06-26 DIAGNOSIS — F70 Mild intellectual disabilities: Secondary | ICD-10-CM

## 2023-06-26 DIAGNOSIS — F39 Unspecified mood [affective] disorder: Secondary | ICD-10-CM | POA: Diagnosis not present

## 2023-06-26 DIAGNOSIS — F902 Attention-deficit hyperactivity disorder, combined type: Secondary | ICD-10-CM | POA: Diagnosis not present

## 2023-06-26 MED ORDER — METHYLPHENIDATE HCL 10 MG PO TABS: 30.0000 mg | ORAL_TABLET | ORAL | 0 refills | Status: DC

## 2023-06-26 MED ORDER — METHYLPHENIDATE HCL 10 MG PO TABS: 10.0000 mg | ORAL_TABLET | ORAL | 0 refills | Status: DC

## 2023-06-26 NOTE — Progress Notes (Signed)
 Virtual Visit via Video Note  I connected with [name] on 06/26/23 at 10:30 AM EDT by a video enabled telemedicine application and verified that I am speaking with the correct person using two identifiers.  Location Provider Location : [facility] Patient Location : Home  Participants: Patient ,Mother, Provider   I discussed the limitations of evaluation and management by telemedicine and the availability of in person appointments. The patient expressed understanding and agreed to proceed.   I discussed the assessment and treatment plan with the patient. The patient was provided an opportunity to ask questions and all were answered. The patient agreed with the plan and demonstrated an understanding of the instructions.   The patient was advised to call back or seek an in-person evaluation if the symptoms worsen or if the condition fails to improve as anticipated.   BH MD OP Progress Note  06/26/2023 11:02 AM [name]  MRN:  [id number]  Chief Complaint:  Chief Complaint  Patient presents with   Follow-up   Depression   Anxiety   ADHD   Weight Loss   Medication Refill   Discussed the use of AI scribe software for clinical note transcription with the patient, who gave verbal consent to proceed.  History of Present Illness [name] is a 28 year old Caucasian male, single, lives in [place], has a history of ADHD, episodic mood disorder, mild intellectual disability, history of seizure disorder was evaluated by telemedicine today.  He is accompanied by his mother who provided collateral information.  He is experiencing significant behavioral issues, particularly anger and irritability, which correlate with the wearing off of his short-acting methylphenidate . His mother describes him as becoming 'ugly' and short-tempered, especially when picked up from work. He is currently taking 10 mg of methylphenidate  twice in the morning and once at lunch.  His current medication  regimen includes 25 mg of Lamictal  in the morning, 200 mg of Seroquel , 100 mg of Lamictal , 20 mg of Lexapro , and 50 mg of trazodone  at bedtime. He is no longer taking nortriptyline or Depakote .  Denies side effects.  He has been experiencing sleep disturbances, waking up too early around 5:30 AM. He does not have blackout curtains, which might be contributing to his early waking due to sunlight. Despite this, he reports sleeping 'pretty good.'  In terms of his general health, he previously struggled with nausea and weight loss but is now reportedly doing fine and eating better.  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 He recently toured a potential new residence with his father, which he found to be nicer than previous options.  Denies thoughts of self-harm or harm to others.    Visit Diagnosis:    ICD-10-CM   1. Episodic mood disorder (HCC)  F39     2. Attention deficit hyperactivity disorder (ADHD), combined type  F90.2 methylphenidate  (RITALIN ) 10 MG tablet    methylphenidate  (RITALIN ) 10 MG tablet    3. Mild intellectual disability  F70       Past Psychiatric History: I have reviewed past psychiatric history from progress note on 03/22/2021.  I have also reviewed progress note from 04/07/2021.  Past Medical History:  Past Medical History:  Diagnosis Date   ADHD    Intellectual disability    Seizures (HCC)    last seizure was at the age of 5   History reviewed. No pertinent surgical history.  Family Psychiatric History: I have reviewed family psychiatric history from progress note on 03/22/2021.  Family History:  Family History  Problem Relation Age of Onset   Mental illness Neg Hx     Social History: I have reviewed social history from progress note on 03/22/2021. Social History   Socioeconomic History   Marital status: Single    Spouse name: Not on file   Number of children: Not on file    Years of education: Not on file   Highest education level: Not on file  Occupational History   Not on file  Tobacco Use   Smoking status: Never    Passive exposure: Never   Smokeless tobacco: Not on file  Vaping Use   Vaping status: Never Used  Substance and Sexual Activity   Alcohol use: No   Drug use: Never   Sexual activity: Not Currently  Other Topics Concern   Not on file  Social History Narrative   PATIENT HAS LEGAL GUARDIAN - [name] ( parents )    Social Drivers of Health   Financial Resource Strain: Patient Declined (05/14/2023)   Received from [facility]   Overall Financial Resource Strain (CARDIA)    Difficulty of Paying Living Expenses: Patient declined  Food Insecurity: Patient Declined (05/14/2023)   Received from [facility]   Hunger Vital Sign    Worried About Running Out of Food in the Last Year: Patient declined    Ran Out of Food in the Last Year: Patient declined  Transportation Needs: Patient Declined (05/14/2023)   Received from [facility]   PRAPARE - Transportation    In the past 12 months, has lack of transportation kept you from medical appointments or from getting medications?: Patient declined    Lack of Transportation (Non-Medical): Patient declined  Physical Activity: Not on file  Stress: Not on file  Social Connections: Not on file    Allergies:  Allergies  Allergen Reactions   Bee Pollen Shortness Of Breath   Dust Mite Extract Other (See Comments)    Congestion and occasional itching of eyes.    Metabolic Disorder Labs: No results found for: "HGBA1C", "MPG" Lab Results  Component Value Date   PROLACTIN 3.2 (L) 03/25/2021   No results found for: "CHOL", "TRIG", "HDL", "CHOLHDL", "VLDL", "LDLCALC" Lab Results  Component Value Date   TSH 2.780 03/20/2022   TSH 5.730 (H) 01/24/2022    Therapeutic Level Labs: Lab Results  Component Value Date   LITHIUM  0.5  05/30/2022   LITHIUM  0.6 01/24/2022   Lab Results  Component Value Date   VALPROATE 47 (L) 12/11/2022   VALPROATE 89 04/20/2021   No results found for: "CBMZ"  Current Medications: Current Outpatient Medications  Medication Sig Dispense Refill   methylphenidate  (RITALIN ) 10 MG tablet Take 1 tablet (10 mg total) by mouth as directed. Take daily at 4 PM , take along with 30 mg daily,total of 40 mg daily in divided dosage 30 tablet 0   escitalopram  (LEXAPRO ) 20 MG tablet Take 1 tablet (20 mg total) by mouth daily. (Patient not taking: Reported on 05/15/2023) 90 tablet 3   lamoTRIgine  (LAMICTAL ) 100 MG tablet Take 1 tablet (100 mg total) by mouth daily. Take along with 25 mg daily- THIS MEDICATION IS CURRENTLY BEING PRESCRIBED FOR SEIZURES BY NEUROLOGY (Patient not taking: Reported on 05/15/2023) 90 tablet 0   lamoTRIgine  (LAMICTAL ) 25 MG tablet Take 1 tablet (25 mg total) by mouth daily. Take  along with 100 mg daily- THIS MEDICATION IS BEING PRESCRIBED BY NEUROLOGY (Patient not taking: Reported on 05/15/2023) 90 tablet 0   LORazepam  (ATIVAN ) 0.5 MG tablet Take 1 tablet (0.5 mg total) by mouth daily as needed for anxiety. For severe anxiety ,agitation- please limit use (Patient not taking: Reported on 05/15/2023) 15 tablet 4   [START ON 07/10/2023] methylphenidate  (RITALIN ) 10 MG tablet Take 3 tablets (30 mg total) by mouth as directed. Take 2 tablets daily at 7:30 AM and 1 tablet daily at 11:30 AM 90 tablet 0   metoCLOPramide  (REGLAN ) 10 MG tablet Take 1 tablet (10 mg total) by mouth 3 (three) times daily with meals for 10 days. 30 tablet 0   promethazine-dextromethorphan (PROMETHAZINE-DM) 6.25-15 MG/5ML syrup Take by mouth.     QUEtiapine  (SEROQUEL  XR) 200 MG 24 hr tablet Take 1 tablet (200 mg total) by mouth at bedtime. (Patient not taking: Reported on 05/15/2023) 90 tablet 0   traZODone  (DESYREL ) 50 MG tablet Take 1 tablet (50 mg total) by mouth at bedtime as needed for sleep. (Patient not taking: Reported  on 05/15/2023)     No current facility-administered medications for this visit.     Musculoskeletal: Strength & Muscle Tone: UTA Gait & Station: Seated Patient leans: N/A  Psychiatric Specialty Exam: Review of Systems  Psychiatric/Behavioral:  Positive for decreased concentration and sleep disturbance. The patient is nervous/anxious.        Mood swings    There were no vitals taken for this visit.There is no height or weight on file to calculate BMI.  General Appearance: Casual  Eye Contact:  Fair  Speech:  Normal Rate  Volume:  Normal  Mood:  Anxious and mood swings  Affect:  Labile  Thought Process:  Goal Directed and Descriptions of Associations: Intact  Orientation:  Full (Time, Place, and Person)  Thought Content: Logical   Suicidal Thoughts:  No  Homicidal Thoughts:  No  Memory:  Immediate;   Fair Recent;   Fair Remote;   limited  Judgement:  Other:  limited  Insight:  Shallow  Psychomotor Activity:  Restlessness  Concentration:  Concentration: Limited and Attention Span: Limited  Recall:  Limited  Fund of Knowledge: Fair  Language: Fair  Akathisia:  No  Handed:  Right  AIMS (if indicated): not done  Assets:  Desire for Improvement Housing Social Support  ADL's:  Intact  Cognition: Baseline  Sleep:  Varies   Screenings: Geneticist, molecular Office Visit from 05/15/2023 in [facility] Office Visit from 03/06/2023 in [facility] Office Visit from 09/26/2022 in [facility] Office Visit from 11/01/2021 in [facility] Video Visit from 09/13/2021 in [facility]  AIMS Total Score 0 0 0 0 0      GAD-7    Flowsheet Row Office Visit from 09/26/2022 in [facility] Office Visit from 05/30/2022 in [facility] Office Visit from 03/16/2022 in [facility] Video Visit from 07/26/2021 in [facility]  Total GAD-7 Score 18 16 19 6       PHQ2-9    Flowsheet Row Office Visit from 09/26/2022 in [facility] Office Visit from 05/30/2022 in [facility] Office Visit from 03/16/2022 in [facility] Video Visit  from 07/26/2021 in [facility] Counselor from 06/15/2021 in [facility]  PHQ-2 Total Score 0 0 0 0 0  PHQ-9 Total Score -- 9 5 -- --      Flowsheet Row Video Visit from 06/26/2023 in [facility] Office Visit from 05/15/2023 in [facility] ED from 05/12/2023 in [facility] Emergency Department at [facility]  C-SSRS RISK CATEGORY No Risk No Risk No Risk        Assessment and Plan: [name] is a 28 year old Caucasian male, single, disability, has a history of ADHD, episodic mood disorder, intellectual disability likely mild was evaluated by telemedicine today.  Discussed assessment and plan as noted below.  Episodic mood disorder-unstable Patient currently with anger/irritability/impulsivity usually in the afternoon.  He was off of his medications for a while due to being medically ill however currently is back on his medications in the past few weeks including his mood stabilizers.  Will benefit from psychotherapy sessions. - Continue Lexapro  20 mg daily - Continue Seroquel  XR 200 mg daily at bedtime (consider dosage increase in the future) - Continue Trazodone  50 mg at bedtime as needed. - Continue Lamotrigine  125 mg daily. - Lamotrigine  as a mood stabilizer however prescribed by neurology, reach out to neurology for  further dosage increase.  He may benefit from the same.  ADHD-unstable Recently was off of all medications including methylphenidate .  Previously did well on Metadate  however due to insurance coverage was changed to methylphenidate  immediate release.  May benefit from a dosage readjustment. - Increase Methylphenidate  to 40 mg daily in divided dosage. - Monitor for side effects including cardiac side effects, sleep problems increased anxiety, appetite suppression. - Consider changing to Dextrostat if needed. - Reviewed Industry PMP AWARxE  Mild intellectual disability-chronic Has good social support system. - Currently looking at group home placement.  Will benefit from labs-including lipid panel, hemoglobin A1c, prolactin level, TSH, LFT, sodium, platelet count.  Patient to get labs completed at primary care provider's office.  Any pending labs to be ordered at next visit.  Collateral information obtained from mother as noted above.  Follow-up Follow-up in clinic in 4 weeks or sooner in person.   Collaboration of Care: Collaboration of Care: Referral or follow-up with counselor/therapist AEB patient to establish care with the therapist.  Provide resources in the community.  Patient also placed on a wait list with our in-house therapist.   Patient/Guardian was advised Release of Information must be obtained prior to any record release in order to collaborate their care with an outside provider. Patient/Guardian was advised if they have not already done so to contact the registration department to sign all necessary forms in order for us  to release information regarding their care.   Consent: Patient/Guardian gives verbal consent for treatment and assignment of benefits for services provided during this visit. Patient/Guardian expressed understanding and agreed to proceed.    Ara Grandmaison, MD 06/27/2023, 11:15 AM

## 2023-07-19 ENCOUNTER — Telehealth: Payer: Self-pay

## 2023-07-19 DIAGNOSIS — F39 Unspecified mood [affective] disorder: Secondary | ICD-10-CM

## 2023-07-19 NOTE — Telephone Encounter (Signed)
 left message that Robertt needs refills o the seroquel  xr 200mg  sent to the [facility]. pt was last seen on 5-20 next appt 6-25

## 2023-07-20 MED ORDER — QUETIAPINE FUMARATE ER 200 MG PO TB24: 200.0000 mg | ORAL_TABLET | Freq: Every day | ORAL | 1 refills | Status: AC

## 2023-07-20 NOTE — Telephone Encounter (Signed)
 I have sent Seroquel  XL 200 mg to [facility] as requested.

## 2023-07-23 NOTE — Telephone Encounter (Signed)
 Pt was already called on Friday and notified.  I did call pt mother back to confirm that they was able to get medication and she said that they did

## 2023-07-30 ENCOUNTER — Ambulatory Visit (INDEPENDENT_AMBULATORY_CARE_PROVIDER_SITE_OTHER): Admitting: Psychiatry

## 2023-07-30 ENCOUNTER — Encounter: Payer: Self-pay | Admitting: Psychiatry

## 2023-07-30 VITALS — BP 113/82 | HR 78 | Temp 98.3°F | Ht 65.0 in | Wt 155.0 lb

## 2023-07-30 DIAGNOSIS — F39 Unspecified mood [affective] disorder: Secondary | ICD-10-CM

## 2023-07-30 DIAGNOSIS — F70 Mild intellectual disabilities: Secondary | ICD-10-CM

## 2023-07-30 DIAGNOSIS — F902 Attention-deficit hyperactivity disorder, combined type: Secondary | ICD-10-CM

## 2023-07-30 MED ORDER — METHYLPHENIDATE HCL 10 MG PO TABS: 10.0000 mg | ORAL_TABLET | Freq: Every day | ORAL | Status: DC | PRN

## 2023-07-30 MED ORDER — LISDEXAMFETAMINE DIMESYLATE 20 MG PO CAPS: 20.0000 mg | ORAL_CAPSULE | Freq: Every morning | ORAL | 0 refills | Status: DC

## 2023-07-30 NOTE — Progress Notes (Signed)
 [facility] MD OP Progress Note  07/30/2023 3:43 PM [name]  MRN:  [id number]  Chief Complaint:  Chief Complaint  Patient presents with   Follow-up   Anxiety   ADHD   Medication Refill   Discussed the use of AI scribe software for clinical note transcription with the patient, who gave verbal consent to proceed.  History of Present Illness Nicholaos Schippers is a 28 year old Caucasian, single, lives in [place], has a history of ADHD, episodic mood disorder, mild intellectual disability, history of seizure disorder was evaluated in office today for a follow-up appointment.  Collateral information obtained from mother since patient is a limited historian.  He is currently taking methylphenidate  40 mg for ADHD, with a regimen of two tablets at 7:30 AM, one at 11:30 AM, and another at 4 PM. The short-acting nature of the medication leads to fluctuations in behavior, with noticeable irritability and mood escalations as the medication wears off. Despite increasing the dose to 40 mg in divided doses, he continues to experience these fluctuations.  His sleep pattern involves going to bed around 8:30 to 9 PM and waking up at 6 AM, which he describes as a good amount of sleep. However, he mentions waking up very early, around sunrise, and sometimes waking his mother up early on weekends. He notes that he has not been sleeping as long as he would like.  His current medication regimen also includes Lamictal  at 125 mg, Seroquel  at 200 mg at bedtime, and trazodone  as needed. He has a history of taking Depakote , which was discontinued.  He denies any side effects to these medications.  He works primarily a morning job, with reduced hours at a second job due to fewer available hours. He is exploring independent living options but has not found a suitable opening yet. He is also preparing for his upcoming 28th birthday in two months.  Denies thoughts of self-harm or harm to others.       Visit Diagnosis:     ICD-10-CM   1. Episodic mood disorder (HCC)  F39     2. Attention deficit hyperactivity disorder (ADHD), combined type  F90.2 methylphenidate  (RITALIN ) 10 MG tablet    lisdexamfetamine  (VYVANSE ) 20 MG capsule    3. Mild intellectual disability  F70       Past Psychiatric History: I have reviewed past psychiatric history from progress note on 03/22/2021.  I have also reviewed progress note from 04/07/2021.  Past Medical History:  Past Medical History:  Diagnosis Date   ADHD    Intellectual disability    Seizures (HCC)    last seizure was at the age of 5   History reviewed. No pertinent surgical history.  Family Psychiatric History: I have reviewed family psychiatric history from progress note on 03/22/2021.  Family History:  Family History  Problem Relation Age of Onset   Mental illness Neg Hx     Social History: I have reviewed social history from progress note on 03/22/2021. Social History   Socioeconomic History   Marital status: Single    Spouse name: Not on file   Number of children: Not on file   Years of education: Not on file   Highest education level: Not on file  Occupational History   Not on file  Tobacco Use   Smoking status: Never    Passive exposure: Never   Smokeless tobacco: Not on file  Vaping Use   Vaping status: Never Used  Substance and Sexual Activity  Alcohol use: No   Drug use: Never   Sexual activity: Not Currently  Other Topics Concern   Not on file  Social History Narrative   PATIENT HAS LEGAL GUARDIAN - [name],[name] and [name] ( parents )    Social Drivers of Health   Financial Resource Strain: Patient Declined (05/14/2023)   Received from [facility]   Overall Financial Resource Strain (CARDIA)    Difficulty of Paying Living Expenses: Patient declined  Food Insecurity: Patient Declined (05/14/2023)   Received from [facility]   Hunger Vital Sign    Within the past 12 months, you worried that your  food would run out before you got the money to buy more.: Patient declined    Within the past 12 months, the food you bought just didn't last and you didn't have money to get more.: Patient declined  Transportation Needs: Patient Declined (05/14/2023)   Received from [facility] - Transportation    In the past 12 months, has lack of transportation kept you from medical appointments or from getting medications?: Patient declined    Lack of Transportation (Non-Medical): Patient declined  Physical Activity: Not on file  Stress: Not on file  Social Connections: Not on file    Allergies:  Allergies  Allergen Reactions   Bee Pollen Shortness Of Breath   Dust Mite Extract Other (See Comments)    Congestion and occasional itching of eyes.   Gramineae Pollens Other (See Comments)    Congestion    Metabolic Disorder Labs: No results found for: HGBA1C, MPG Lab Results  Component Value Date   PROLACTIN 3.2 (L) 03/25/2021   No results found for: CHOL, TRIG, HDL, CHOLHDL, VLDL, LDLCALC Lab Results  Component Value Date   TSH 2.780 03/20/2022   TSH 5.730 (H) 01/24/2022    Therapeutic Level Labs: Lab Results  Component Value Date   LITHIUM  0.5 05/30/2022   LITHIUM  0.6 01/24/2022   Lab Results  Component Value Date   VALPROATE 47 (L) 12/11/2022   VALPROATE 89 04/20/2021   No results found for: CBMZ  Current Medications: Current Outpatient Medications  Medication Sig Dispense Refill   escitalopram  (LEXAPRO ) 20 MG tablet Take 1 tablet (20 mg total) by mouth daily. 90 tablet 3   lamoTRIgine  (LAMICTAL ) 100 MG tablet Take 1 tablet (100 mg total) by mouth daily. Take along with 25 mg daily- THIS MEDICATION IS CURRENTLY BEING PRESCRIBED FOR SEIZURES BY NEUROLOGY 90 tablet 0   lamoTRIgine  (LAMICTAL ) 25 MG tablet Take 1 tablet (25 mg total) by mouth daily. Take along with 100 mg daily- THIS MEDICATION IS BEING PRESCRIBED BY NEUROLOGY 90 tablet 0    lisdexamfetamine  (VYVANSE ) 20 MG capsule Take 1 capsule (20 mg total) by mouth in the morning. 30 capsule 0   promethazine-dextromethorphan (PROMETHAZINE-DM) 6.25-15 MG/5ML syrup Take by mouth.     QUEtiapine  (SEROQUEL  XR) 200 MG 24 hr tablet Take 1 tablet (200 mg total) by mouth at bedtime. 90 tablet 1   traZODone  (DESYREL ) 50 MG tablet Take 1 tablet (50 mg total) by mouth at bedtime as needed for sleep.     methylphenidate  (RITALIN ) 10 MG tablet Take 1 tablet (10 mg total) by mouth daily as needed. Take at 4 PM as needed     No current facility-administered medications for this visit.     Musculoskeletal: Strength & Muscle Tone: within normal limits Gait & Station: normal Patient leans: N/A  Psychiatric Specialty Exam: Review  of Systems  Psychiatric/Behavioral:  Positive for sleep disturbance (Improving).        Irritable    Blood pressure 113/82, pulse 78, temperature 98.3 F (36.8 C), temperature source Temporal, height 5' 5 (1.651 m), weight 155 lb (70.3 kg), SpO2 95%.Body mass index is 25.79 kg/m.  General Appearance: Casual  Eye Contact:  Fair  Speech:  Clear and Coherent  Volume:  Normal  Mood:  Irritable  Affect:  Full Range  Thought Process:  Goal Directed and Descriptions of Associations: Intact  Orientation:  Full (Time, Place, and Person)  Thought Content: Logical   Suicidal Thoughts:  No  Homicidal Thoughts:  No  Memory:  Immediate;   Fair Recent;   Fair Remote;   limited  Judgement:  Other:  Limited  Insight:  Shallow  Psychomotor Activity:  Normal  Concentration:  Concentration: Fair and Attention Span: Fair  Recall:  [employer] of Knowledge: Fair  Language: Fair  Akathisia:  No  Handed:  Right  AIMS (if indicated): not done  Assets:  Communication Skills Desire for Improvement Housing Social Support  ADL's:  Intact  Cognition: Baseline  Sleep:  Varies   Screenings: Geneticist, Molecular Office Visit from 07/30/2023 in [facility] Visit from 05/15/2023 in [facility] Office Visit from 03/06/2023 in [facility] Office Visit from 09/26/2022 in [facility] Office Visit from 11/01/2021 in [facility]  AIMS Total Score 0 0 0 0 0   GAD-7    Flowsheet Row Office Visit from 09/26/2022 in [facility] Office Visit from 05/30/2022 in [facility] Office Visit from 03/16/2022 in [facility] Video Visit from 07/26/2021 in [facility]  Total GAD-7 Score 18 16 19 6    PHQ2-9    Flowsheet Row Office Visit from 09/26/2022 in [facility] Office Visit from 05/30/2022 in [facility] Office Visit from 03/16/2022 in [facility] Video Visit from 07/26/2021 in [facility] Counselor from 06/15/2021 in [facility]  PHQ-2 Total Score 0 0 0 0 0  PHQ-9 Total Score -- 9 5 -- --   Flowsheet Row Office Visit from 07/30/2023 in [facility] Video Visit from 06/26/2023 in [facility] Office Visit from 05/15/2023 in [facility]  C-SSRS RISK CATEGORY No Risk No Risk No Risk     Assessment and Plan: [name] is a 28 year old Caucasian male, single, disabled, has a history of ADHD, episodic mood disorder, intellectual disability, was evaluated in office today.  Discussed assessment and plan as noted below.  Episodic mood disorder-improving Currently mood symptoms have improved although likely  struggling with irritability due to short-acting methylphenidate  especially when it wears off. Continue Lexapro  20 mg daily Continue Seroquel  XR 200 mg at bedtime Continue Trazodone  50 mg at bedtime as needed Continue Lamotrigine  125 mg daily   ADHD-unstable Currently struggling with irritability when methylphenidate  wears off.  Previously did well on long-acting stimulant however currently insurance coverage issues for long-acting form.  Agreeable to trial of Vyvanse . Discontinue Methylphenidate  40 mg. Could use Methylphenidate  10 mg daily as needed at 4 PM. Start Vyvanse  20 mg  daily in the morning. Provided medication education. Reviewed [place] PMP AWARxE  Mild intellectual disability-chronic He has good social support system. Continue to monitor closely.  Pending labs-hemoglobin A1c, lipid panel, prolactin, TSH, LFT, sodium, platelet count.  Patient to get this done at primary care provider's office and any pending will be ordered.  Collateral information obtained from mother as noted above  Follow-up in clinic in 4 weeks or sooner if needed.   Collaboration of Care: Collaboration of Care: Referral or follow-up with counselor/therapist AEB patient encouraged to follow up with therapist has upcoming appointment with Ms. Perkins.  Patient/Guardian was advised Release of Information must be obtained prior to any record release in order to collaborate their care with an outside provider. Patient/Guardian was advised if they have not already done so to contact the registration department to sign all necessary forms in order for us  to release information regarding their care.   Consent: Patient/Guardian gives verbal consent for treatment and assignment of benefits for services provided during this visit. Patient/Guardian expressed understanding and agreed to proceed.   This note was generated in part or whole with voice recognition software. Voice recognition is usually quite accurate but  there are transcription errors that can and very often do occur. I apologize for any typographical errors that were not detected and corrected.    Jolicia Delira, MD 07/31/2023, 12:40 PM

## 2023-08-02 ENCOUNTER — Telehealth: Payer: Self-pay

## 2023-08-02 DIAGNOSIS — F902 Attention-deficit hyperactivity disorder, combined type: Secondary | ICD-10-CM

## 2023-08-02 MED ORDER — METHYLPHENIDATE HCL 10 MG PO TABS: 30.0000 mg | ORAL_TABLET | ORAL | 0 refills | Status: DC

## 2023-08-02 NOTE — Telephone Encounter (Signed)
 prior shara was denied.  Appeal sent. - pending

## 2023-08-02 NOTE — Telephone Encounter (Signed)
 went online to [email or web address] and submitted the prior auth . - pending

## 2023-08-02 NOTE — Telephone Encounter (Signed)
 received email from [email or web address] that a prior auth was needed for the lisdexamfetamine

## 2023-08-03 NOTE — Telephone Encounter (Signed)
 Noted

## 2023-08-27 ENCOUNTER — Ambulatory Visit (INDEPENDENT_AMBULATORY_CARE_PROVIDER_SITE_OTHER): Admitting: Psychiatry

## 2023-08-27 ENCOUNTER — Encounter: Payer: Self-pay | Admitting: Psychiatry

## 2023-08-27 VITALS — BP 124/86 | HR 72 | Temp 98.9°F | Ht 65.0 in | Wt 155.6 lb

## 2023-08-27 DIAGNOSIS — Z79899 Other long term (current) drug therapy: Secondary | ICD-10-CM | POA: Diagnosis not present

## 2023-08-27 DIAGNOSIS — F70 Mild intellectual disabilities: Secondary | ICD-10-CM

## 2023-08-27 DIAGNOSIS — F902 Attention-deficit hyperactivity disorder, combined type: Secondary | ICD-10-CM

## 2023-08-27 DIAGNOSIS — F3162 Bipolar disorder, current episode mixed, moderate: Secondary | ICD-10-CM | POA: Diagnosis not present

## 2023-08-27 MED ORDER — DIVALPROEX SODIUM 250 MG PO DR TAB
1000.0000 mg | DELAYED_RELEASE_TABLET | ORAL | 1 refills | Status: DC
Start: 2023-08-27 — End: 2023-10-02

## 2023-08-27 MED ORDER — METHYLPHENIDATE HCL 10 MG PO TABS: 40.0000 mg | ORAL_TABLET | ORAL | 0 refills | Status: DC

## 2023-08-27 NOTE — Progress Notes (Unsigned)
 [facility] MD OP Progress Note  08/27/2023 3:41 PM [name]  MRN:  [id number]  Chief Complaint:  Chief Complaint  Patient presents with   Follow-up   Anxiety   ADD   Medication Refill   Discussed the use of AI scribe software for clinical note transcription with the patient, who gave verbal consent to proceed.  History of Present Illness [name] is a 28 year old Caucasian male single, lives in [place], has a history of ADHD, mood disorder, mild intellectual disability, history of seizure disorder was evaluated in office today for a follow-up appointment. He is accompanied by his mother, [name].  Over the past few weeks, he has experienced worsening mood symptoms, including increased anger, yelling, and agitation. These symptoms are exacerbated when he is unable to ride his bike due to weather conditions, which he finds distressing. His mother confirms that these symptoms have intensified recently.  Patient appeared to be extremely labile, irritable and agitated in session today.  Patient also hyperverbal in session needed redirection multiple times.  According to mother he does sleep well on the Seroquel  and recently has not had any problem however she has been having trouble managing him due to his irritability and agitation.  He used to be on Depakote  previously and may have tolerated it well.  Mother wonders if he can be placed back on the Depakote .  He is currently taking Lexapro , Seroquel , trazodone  as needed, Lamictal  125 mg, and Ritalin .  Denies side effects.  His daily routine includes waking up at 6 AM, going to work, and returning home by 7 PM. He reports sleeping well, going to bed around 9 PM. However, he experiences frustration due to weather conditions affecting his ability to ride his bike.  He is resistant to engaging in therapy, stating he does not feel he needs it and believes he has enough support. His mother is concerned about his ability to manage his mood without  additional support. He has a history of working with a therapist in the past, which he found beneficial, but he is currently not in therapy.  He denies any suicidality, homicidality or perceptual disturbances.  Visit Diagnosis:    ICD-10-CM   1. Bipolar 1 disorder, mixed, moderate (HCC)  F31.62 divalproex  (DEPAKOTE ) 250 MG DR tablet    Sodium    Platelet count    Hepatic function panel    2. Attention deficit hyperactivity disorder (ADHD), combined type  F90.2 methylphenidate  (RITALIN ) 10 MG tablet    3. Mild intellectual disability  F70     4. High risk medication use  Z79.899 Valproic acid  level    Sodium    Platelet count    Hepatic function panel      Past Psychiatric History: I have reviewed past psychiatric history from progress note on 03/22/2021.  I have also reviewed progress note from 04/07/2021.  Past Medical History:  Past Medical History:  Diagnosis Date   ADHD    Intellectual disability    Seizures (HCC)    last seizure was at the age of 5   History reviewed. No pertinent surgical history.  Family Psychiatric History: I have reviewed family psychiatric history from progress note on 03/22/2021.  Family History:  Family History  Problem Relation Age of Onset   Mental illness Neg Hx     Social History: I have reviewed social history from progress note on 03/22/2021. Social History   Socioeconomic History   Marital status: Single    Spouse name: Not  on file   Number of children: Not on file   Years of education: Not on file   Highest education level: Not on file  Occupational History   Not on file  Tobacco Use   Smoking status: Never    Passive exposure: Never   Smokeless tobacco: Not on file  Vaping Use   Vaping status: Never Used  Substance and Sexual Activity   Alcohol use: No   Drug use: Never   Sexual activity: Not Currently  Other Topics Concern   Not on file  Social History Narrative   PATIENT HAS LEGAL GUARDIAN - [name],[name] and [name] (  parents )    Social Drivers of Corporate investment banker Strain: Patient Declined (05/14/2023)   Received from [facility]   Overall Financial Resource Strain (CARDIA)    Difficulty of Paying Living Expenses: Patient declined  Food Insecurity: Patient Declined (05/14/2023)   Received from [facility]   Hunger Vital Sign    Within the past 12 months, you worried that your food would run out before you got the money to buy more.: Patient declined    Within the past 12 months, the food you bought just didn't last and you didn't have money to get more.: Patient declined  Transportation Needs: Patient Declined (05/14/2023)   Received from [facility] - Transportation    In the past 12 months, has lack of transportation kept you from medical appointments or from getting medications?: Patient declined    Lack of Transportation (Non-Medical): Patient declined  Physical Activity: Not on file  Stress: Not on file  Social Connections: Not on file    Allergies:  Allergies  Allergen Reactions   Bee Pollen Shortness Of Breath   Dust Mite Extract Other (See Comments)    Congestion and occasional itching of eyes.   Gramineae Pollens Other (See Comments)    Congestion    Metabolic Disorder Labs: No results found for: HGBA1C, MPG Lab Results  Component Value Date   PROLACTIN 3.2 (L) 03/25/2021   No results found for: CHOL, TRIG, HDL, CHOLHDL, VLDL, LDLCALC Lab Results  Component Value Date   TSH 2.780 03/20/2022   TSH 5.730 (H) 01/24/2022    Therapeutic Level Labs: Lab Results  Component Value Date   LITHIUM  0.5 05/30/2022   LITHIUM  0.6 01/24/2022   Lab Results  Component Value Date   VALPROATE 47 (L) 12/11/2022   VALPROATE 89 04/20/2021   No results found for: CBMZ  Current Medications: Current Outpatient Medications  Medication Sig Dispense Refill   divalproex  (DEPAKOTE ) 250 MG DR tablet Take 4  tablets (1,000 mg total) by mouth as directed. Take 1 tablet daily morning , 1 tablet daily at 2 PM and 2 tablets at bedtime 120 tablet 1   escitalopram  (LEXAPRO ) 20 MG tablet Take 1 tablet (20 mg total) by mouth daily. 90 tablet 3   lamoTRIgine  (LAMICTAL ) 100 MG tablet Take 1 tablet (100 mg total) by mouth daily. Take along with 25 mg daily- THIS MEDICATION IS CURRENTLY BEING PRESCRIBED FOR SEIZURES BY NEUROLOGY 90 tablet 0   lamoTRIgine  (LAMICTAL ) 25 MG tablet Take 1 tablet (25 mg total) by mouth daily. Take along with 100 mg daily- THIS MEDICATION IS BEING PRESCRIBED BY NEUROLOGY 90 tablet 0   methylphenidate  (RITALIN ) 10 MG tablet Take 1 tablet (10 mg total) by mouth daily as needed. Take at 4 PM as needed     promethazine-dextromethorphan (  PROMETHAZINE-DM) 6.25-15 MG/5ML syrup Take by mouth.     QUEtiapine  (SEROQUEL  XR) 200 MG 24 hr tablet Take 1 tablet (200 mg total) by mouth at bedtime. 90 tablet 1   traZODone  (DESYREL ) 50 MG tablet Take 1 tablet (50 mg total) by mouth at bedtime as needed for sleep.     [START ON 09/01/2023] methylphenidate  (RITALIN ) 10 MG tablet Take 4 tablets (40 mg total) by mouth as directed. Take 2 tablets daily at 7:30 AM and 1 tablet daily at 11:30 AM and 1 tablet daily at 3:30 PM 120 tablet 0   No current facility-administered medications for this visit.     Musculoskeletal: Strength & Muscle Tone: within normal limits Gait & Station: normal Patient leans: N/A  Psychiatric Specialty Exam: Review of Systems  Unable to perform ROS: Psychiatric disorder    Blood pressure 124/86, pulse 72, temperature 98.9 F (37.2 C), temperature source Temporal, height 5' 5 (1.651 m), weight 155 lb 9.6 oz (70.6 kg), SpO2 99%.Body mass index is 25.89 kg/m.  General Appearance: Casual  Eye Contact:  Fair  Speech:  Clear and Coherent  Volume:  Normal  Mood:  Irritable  Affect:  Congruent  Thought Process:  Goal Directed and Descriptions of Associations: Intact   Orientation:  Other:  self , situation  Thought Content: Logical   Suicidal Thoughts:  No  Homicidal Thoughts:  No  Memory:  Immediate;   Fair Recent;   Fair Remote;   Poor  Judgement:  Other:  limited  Insight:  Fair  Psychomotor Activity:  Normal  Concentration:  Concentration: Fair and Attention Span: Fair  Recall:  [employer] of Knowledge: Fair  Language: Fair  Akathisia:  No  Handed:  Right  AIMS (if indicated): done  Assets:  Communication Skills Desire for Improvement Housing Social Support  ADL's:  Intact  Cognition: baseline  Sleep:  improving   Screenings: Geneticist, molecular Office Visit from 07/30/2023 in [facility] Office Visit from 05/15/2023 in [facility] Office Visit from 03/06/2023 in [facility] Office Visit from 09/26/2022 in [facility] Office Visit from 11/01/2021 in [facility]  AIMS Total Score 0 0 0 0 0   GAD-7    Flowsheet Row Office Visit from 09/26/2022 in [facility] Office Visit from 05/30/2022 in [facility] Office Visit from 03/16/2022 in [facility] Video Visit from 07/26/2021 in [facility]  Total GAD-7 Score 18 16 19 6    PHQ2-9    Flowsheet Row Office Visit from 09/26/2022 in [facility] Office Visit from 05/30/2022 in [facility] Office Visit from 03/16/2022 in [facility] Video Visit from 07/26/2021 in [facility] Counselor from 06/15/2021 in [facility]  PHQ-2 Total Score 0 0 0 0 0  PHQ-9  Total Score -- 9 5 -- --   Flowsheet Row Office Visit from 07/30/2023 in [facility] Video Visit from 06/26/2023 in [facility] Office Visit from 05/15/2023 in [facility]  C-SSRS RISK CATEGORY No Risk No Risk No Risk     Assessment and Plan: [name] is a 28 year old Caucasian male, single,  disabled, has a history of ADHD, episodic mood disorder, intellectual disability was evaluated in office today.  Discussed assessment and plan as noted below.  Bipolar disorder type I mixed moderate-unstable Currently with worsening irritability, agitation exacerbated by recent stressors including job-related stresses, current weather.  Will benefit from reinitiation of Depakote  to augment current medication regimen.  Patient will also benefit from psychotherapy sessions and has upcoming appointment scheduled with Ms. Perkins Continue Lexapro  20 mg daily Continue Seroquel  XR 200 mg daily at bedtime Continue Lamictal  125 mg daily Restart Depakote  DR 1000 mg daily in divided dosage. Patient aware about drug to drug interaction between Depakote  and Lamictal . Continue trazodone  50 mg at bedtime as needed Patient will benefit from individual psychotherapy.  If patient does not improve we will consider referral for couple higher level of care.  ADHD-unstable Currently concentration and focus problems limited due to worsening mood symptoms.  Currently on methylphenidate  40 mg and is tolerating it well. Continue methylphenidate  40 mg in divided dosage Reviewed [place] PMP AWARxE  Mild intellectual disability-chronic He has good social support system. Continue to monitor closely.  High risk medication use-will order Depakote  level, LFT, sodium level, platelet count.  [facility] to [facility] to [facility] in a week after starting Depakote .  Collateral information obtained from mother as noted  above.  Follow-up Follow-up in clinic in a month or sooner as needed.  Collaboration of Care: Collaboration of Care: Referral or follow-up with counselor/therapist AEB encouraged to follow up with therapist.  Patient/Guardian was advised Release of Information must be obtained prior to any record release in order to collaborate their care with an outside provider. Patient/Guardian was advised if they have not already done so to contact the registration department to sign all necessary forms in order for us  to release information regarding their care.   Consent: Patient/Guardian gives verbal consent for treatment and assignment of benefits for services provided during this visit. Patient/Guardian expressed understanding and agreed to proceed.   This note was generated in part or whole with voice recognition software. Voice recognition is usually quite accurate but there are transcription errors that can and very often do occur. I apologize for any typographical errors that were not detected and corrected.    Khush Pasion, MD 08/27/2023, 3:41 PM

## 2023-08-27 NOTE — Patient Instructions (Signed)
 [email or web address] [address] [place], [place] [address]   [phone]

## 2023-09-03 ENCOUNTER — Telehealth: Payer: Self-pay

## 2023-09-03 DIAGNOSIS — F902 Attention-deficit hyperactivity disorder, combined type: Secondary | ICD-10-CM

## 2023-09-03 MED ORDER — METHYLPHENIDATE HCL 10 MG PO TABS
30.0000 mg | ORAL_TABLET | ORAL | 0 refills | Status: DC
Start: 2023-09-03 — End: 2023-10-02

## 2023-09-03 NOTE — Telephone Encounter (Signed)
 [facility] pharmacy spoke to [name] she stated that she would get the Methylphenidate  cancelled

## 2023-09-03 NOTE — Telephone Encounter (Signed)
 CMA was able to contact pharmacy to cancel previous prescription sent out with the 40 mg daily. I have sent a new prescription with 30 mg daily 3 tablets a day to pharmacy per request.

## 2023-09-03 NOTE — Telephone Encounter (Signed)
 pt insurance will not pay for more than 3 pills of the methylphenidate . please send reg rx.

## 2023-09-07 ENCOUNTER — Ambulatory Visit: Payer: Self-pay | Admitting: Psychiatry

## 2023-09-07 LAB — VALPROIC ACID LEVEL: Valproic Acid Lvl: 45 ug/mL — ABNORMAL LOW (ref 50–100)

## 2023-09-07 LAB — SODIUM: Sodium: 142 mmol/L (ref 134–144)

## 2023-09-07 LAB — HEPATIC FUNCTION PANEL
ALT: 14 IU/L (ref 0–44)
AST: 22 IU/L (ref 0–40)
Albumin: 4.7 g/dL (ref 4.3–5.2)
Alkaline Phosphatase: 71 IU/L (ref 44–121)
Bilirubin Total: 0.2 mg/dL (ref 0.0–1.2)
Bilirubin, Direct: 0.09 mg/dL (ref 0.00–0.40)
Total Protein: 6.7 g/dL (ref 6.0–8.5)

## 2023-09-07 LAB — PLATELET COUNT: Platelets: 192 x10E3/uL (ref 150–450)

## 2023-10-02 ENCOUNTER — Telehealth: Admitting: Psychiatry

## 2023-10-02 ENCOUNTER — Encounter: Payer: Self-pay | Admitting: Psychiatry

## 2023-10-02 ENCOUNTER — Telehealth: Payer: Self-pay

## 2023-10-02 DIAGNOSIS — F70 Mild intellectual disabilities: Secondary | ICD-10-CM

## 2023-10-02 DIAGNOSIS — F902 Attention-deficit hyperactivity disorder, combined type: Secondary | ICD-10-CM

## 2023-10-02 DIAGNOSIS — Z79899 Other long term (current) drug therapy: Secondary | ICD-10-CM | POA: Diagnosis not present

## 2023-10-02 DIAGNOSIS — F3162 Bipolar disorder, current episode mixed, moderate: Secondary | ICD-10-CM | POA: Diagnosis not present

## 2023-10-02 MED ORDER — METHYLPHENIDATE HCL 10 MG PO TABS: 30.0000 mg | ORAL_TABLET | ORAL | 0 refills | Status: DC

## 2023-10-02 MED ORDER — DIVALPROEX SODIUM 250 MG PO DR TAB: 1250.0000 mg | DELAYED_RELEASE_TABLET | ORAL | 1 refills | Status: DC

## 2023-10-02 NOTE — Telephone Encounter (Signed)
 Called patients mother to make aware that the lab order was not able to get the fax to go through she stated that she would stop by the office and pick it up

## 2023-10-02 NOTE — Progress Notes (Signed)
 Virtual Visit via Video Note  I connected with [name] on 10/02/23 at  8:30 AM EDT by a video enabled telemedicine application and verified that I am speaking with the correct person using two identifiers.  Location Provider Location : [facility] Patient Location : Home  Participants: Patient , Mother,Provider   I discussed the limitations of evaluation and management by telemedicine and the availability of in person appointments. The patient expressed understanding and agreed to proceed.   I discussed the assessment and treatment plan with the patient. The patient was provided an opportunity to ask questions and all were answered. The patient agreed with the plan and demonstrated an understanding of the instructions.   The patient was advised to call back or seek an in-person evaluation if the symptoms worsen or if the condition fails to improve as anticipated.   BH MD OP Progress Note  10/03/2023 2:08 PM Zayed Griffie  MRN:  [id number]  Chief Complaint:  Chief Complaint  Patient presents with   Follow-up   Anxiety   Depression   ADD   Medication Refill   Discussed the use of AI scribe software for clinical note transcription with the patient, who gave verbal consent to proceed.  History of Present Illness [name] is a 28 year old Caucasian male, single, lives in [place], has a history of ADHD, mood disorder, mild intellectual disability, history of seizure disorder was evaluated by telemedicine today. He is accompanied by his mother who is his legal guardian.  Recent mood instability, including increased irritability and anger outbursts, has affected him, particularly following the end of a positive event (his birthday week). He describes episodes of yelling and fussing in the morning and acknowledges difficulty controlling his temper, especially after significant events conclude. His mother notes a pattern of behavioral escalation when enjoyable activities end,  describing him as acting out during these transitions. He expresses frustration with his current coping strategies and recognizes that his efforts to manage his mood have not been effective lately. He identifies anxiety, depression, and mood stimulation as ongoing challenges and describes attempts to calm himself by spending time alone in his room, reading, or watching television. He expresses resistance to starting therapy, stating he does not feel it would be helpful.  He describes some difficulty with medication adherence, noting occasional missed doses due to confusion with his pill box. His mother confirms she assists with medication management and supervises his regimen. He currently takes Depakote , Lexapro , Lamictal , Ritalin , Seroquel  at bedtime, and trazodone  as needed. He reports that Depakote  sometimes makes him sleepy at work. He also describes experiencing heartburn and physical discomfort, which he sometimes relates to his medications.  Variable sleep quality affects him, and he states he sometimes wakes with back and shoulder pain, which he attributes to his sleeping position.   His mother reports that he previously took a higher dose of Depakote  and used other medications in the past, though she does not recall specific details.   He plays bingo and Wii, goes swimming, and watches television. He rides his bike every other week, daily when weather is good.  He denies any suicidality, homicidality or perceptual disturbances.  However appeared to be agitated and distracted throughout the session requiring multiple redirection attempts.   Visit Diagnosis:    ICD-10-CM   1. Bipolar 1 disorder, mixed, moderate (HCC)  F31.62 divalproex  (DEPAKOTE ) 250 MG DR tablet    Valproic acid  level    2. Attention deficit hyperactivity disorder (ADHD), combined type  F90.2 methylphenidate  (  RITALIN ) 10 MG tablet    3. Mild intellectual disability  F70     4. High risk medication use  Z79.899  Valproic acid  level      Past Psychiatric History: I have reviewed past psychiatric history from progress note on 03/22/2021.  I have also reviewed progress note from 04/17/2021.  Past Medical History:  Past Medical History:  Diagnosis Date   ADHD    Intellectual disability    Seizures (HCC)    last seizure was at the age of 5   History reviewed. No pertinent surgical history.  Family Psychiatric History: I have reviewed family psychiatric history from progress note on 03/22/2021.  Family History:  Family History  Problem Relation Age of Onset   Mental illness Neg Hx     Social History: I have reviewed social history from progress note on 03/22/2021. Social History   Socioeconomic History   Marital status: Single    Spouse name: Not on file   Number of children: Not on file   Years of education: Not on file   Highest education level: Not on file  Occupational History   Not on file  Tobacco Use   Smoking status: Never    Passive exposure: Never   Smokeless tobacco: Not on file  Vaping Use   Vaping status: Never Used  Substance and Sexual Activity   Alcohol use: No   Drug use: Never   Sexual activity: Not Currently  Other Topics Concern   Not on file  Social History Narrative   PATIENT HAS LEGAL GUARDIAN - [name],[name] and [name] ( parents )    Social Drivers of Corporate Investment Banker Strain: Patient Declined (05/14/2023)   Received from [facility]   Overall Financial Resource Strain (CARDIA)    Difficulty of Paying Living Expenses: Patient declined  Food Insecurity: Patient Declined (05/14/2023)   Received from [facility]   Hunger Vital Sign    Within the past 12 months, you worried that your food would run out before you got the money to buy more.: Patient declined    Within the past 12 months, the food you bought just didn't last and you didn't have money to get more.: Patient declined  Transportation Needs: Patient  Declined (05/14/2023)   Received from [facility]    In the past 12 months, has lack of transportation kept you from medical appointments or from getting medications?: Patient declined    Lack of Transportation (Non-Medical): Patient declined  Physical Activity: Not on file  Stress: Not on file  Social Connections: Not on file    Allergies:  Allergies  Allergen Reactions   Bee Pollen Shortness Of Breath   Dust Mite Extract Other (See Comments)    Congestion and occasional itching of eyes.   Gramineae Pollens Other (See Comments)    Congestion    Metabolic Disorder Labs: No results found for: HGBA1C, MPG Lab Results  Component Value Date   PROLACTIN 3.2 (L) 03/25/2021   No results found for: CHOL, TRIG, HDL, CHOLHDL, VLDL, LDLCALC Lab Results  Component Value Date   TSH 2.780 03/20/2022   TSH 5.730 (H) 01/24/2022    Therapeutic Level Labs: Lab Results  Component Value Date   LITHIUM  0.5 05/30/2022   LITHIUM  0.6 01/24/2022   Lab Results  Component Value Date   VALPROATE 45 (L) 09/06/2023   VALPROATE 47 (L) 12/11/2022   No results found for:  CBMZ  Current Medications: Current Outpatient Medications  Medication Sig Dispense Refill   divalproex  (DEPAKOTE ) 250 MG DR tablet Take 5 tablets (1,250 mg total) by mouth as directed. Take 1 tablet daily morning , 2 tablet daily at 2 PM and 2 tablets at bedtime 150 tablet 1   escitalopram  (LEXAPRO ) 20 MG tablet Take 1 tablet (20 mg total) by mouth daily. 90 tablet 3   lamoTRIgine  (LAMICTAL ) 100 MG tablet Take 1 tablet (100 mg total) by mouth daily. Take along with 25 mg daily- THIS MEDICATION IS CURRENTLY BEING PRESCRIBED FOR SEIZURES BY NEUROLOGY 90 tablet 0   lamoTRIgine  (LAMICTAL ) 25 MG tablet Take 1 tablet (25 mg total) by mouth daily. Take along with 100 mg daily- THIS MEDICATION IS BEING PRESCRIBED BY NEUROLOGY 90 tablet 0   methylphenidate  (RITALIN ) 10 MG tablet  Take 3 tablets (30 mg total) by mouth as directed. Take 2 tablets daily at 7:30 AM and 1 tablet daily at 11:30 AM 90 tablet 0   promethazine-dextromethorphan (PROMETHAZINE-DM) 6.25-15 MG/5ML syrup Take by mouth.     QUEtiapine  (SEROQUEL  XR) 200 MG 24 hr tablet Take 1 tablet (200 mg total) by mouth at bedtime. 90 tablet 1   traZODone  (DESYREL ) 50 MG tablet Take 1 tablet (50 mg total) by mouth at bedtime as needed for sleep.     No current facility-administered medications for this visit.     Musculoskeletal: Strength & Muscle Tone: UTA Gait & Station: Seated Patient leans: N/A  Psychiatric Specialty Exam: Review of Systems  Psychiatric/Behavioral:         Mood swings and irritability    There were no vitals taken for this visit.There is no height or weight on file to calculate BMI.  General Appearance: Casual  Eye Contact:  Fair  Speech:  Clear and Coherent  Volume:  Normal  Mood:  Anxious and mood swings  Affect:  Appropriate  Thought Process:  Goal Directed and Descriptions of Associations: Circumstantial  Orientation:  Full (Time, Place, and Person)  Thought Content: Rumination   Suicidal Thoughts:  No  Homicidal Thoughts:  No  Memory:  Immediate;   Fair Recent;   Fair Remote;   Poor  Judgement:  Other:  limited  Insight:  Shallow  Psychomotor Activity:  Increased  Concentration:  Concentration: Poor and Attention Span: Poor  Recall:  Poor  Fund of Knowledge: Fair  Language: Fair  Akathisia:  No  Handed:  Right  AIMS (if indicated): not done  Assets:  Manufacturing Systems Engineer Desire for Improvement Housing Social Support Transportation  ADL's:  Intact  Cognition: baseline  Sleep:  improving   Screenings: Geneticist, Molecular Office Visit from 08/27/2023 in [facility] Office Visit from 07/30/2023 in [facility] Visit from 05/15/2023 in [facility] Visit from 03/06/2023 in [facility] Office Visit from 09/26/2022 in [facility]  AIMS Total Score 0 0 0 0 0   GAD-7    Flowsheet Row Office Visit from 08/27/2023 in [facility] Office Visit from 09/26/2022 in [facility] Office Visit from 05/30/2022 in  [facility] Office Visit from 03/16/2022 in [facility] Video Visit from 07/26/2021 in [facility]  Total GAD-7 Score 17 18 16 19 6    [phone]  Flowsheet Row Office Visit from 08/27/2023 in [facility] Office Visit from 09/26/2022 in [facility] Office Visit from 05/30/2022 in [facility] Office Visit from 03/16/2022 in [facility] Video Visit from 07/26/2021 in [facility]  PHQ-2 Total Score 0 0 0 0 0  PHQ-9 Total Score 9 -- 9 5 --   Flowsheet Row Video Visit from 10/02/2023 in [facility] Office Visit from 08/27/2023 in [facility] Office Visit from 07/30/2023 in [facility]  C-SSRS RISK CATEGORY No Risk No Risk No Risk     Assessment and Plan: Aiyden Lauderback is a 28 year old Caucasian male, single, disabled, has a history of ADHD, episodic mood disorder, intellectual disability was evaluated by telemedicine today.  Discussed assessment and plan as noted below.  Bipolar disorder type I mixed moderate-unstable Continues to have irritability, mood swings, behavioral problems likely situational.  As per mother patient continues to be  argumentative and certain situations only.  Has upcoming appointment for psychotherapy scheduled however patient hesitant.  Discussed increasing Depakote  dosage. Increase Depakote  ER to 1250 mg daily in divided dosage Continue Lexapro  20 mg daily Continue Seroquel  XR 200 mg daily at bedtime Continue Lamictal  125 mg daily Patient as well as legal guardian aware about drug to drug interaction between Depakote  and Lamictal . Continue Trazodone  50 mg at bedtime as needed Patient provided education about starting psychotherapy. Will consider referral to a higher level of care if patient does not make progress.   ADHD-improving Reports attention and focus is improving and denies any side effects to methylphenidate . Continue Methylphenidate  30 mg daily in divided dosage Reviewed [place] AWARxE  Mild intellectual disability-chronic He continues to have good support system. We will reevaluate in future sessions.  High risk medication use-will order Depakote  level.  Patient to get this done 5 days after starting the new dosage.  Lab slip to be faxed to [facility] of choice.  Reviewed and discussed most recent labs including Depakote  level dated 09/06/2023-45-subtherapeutic, sodium, platelet count and LFT-within normal limits.  Collateral information obtained from mother who was present in session.  Follow-up Follow-up in clinic in 6 to 7 weeks or sooner as needed   Collaboration of Care: Collaboration of Care: Referral or follow-up with counselor/therapist AEB encouraged to keep the appointment with therapist as scheduled.  Patient/Guardian was advised Release of Information must be obtained prior to any record release in order to collaborate their care with an outside provider. Patient/Guardian was advised if they have not already done so to contact the registration department to sign all necessary forms in order for us  to release information regarding their care.   Consent: Patient/Guardian gives  verbal consent for treatment and assignment of benefits for services provided during this visit. Patient/Guardian expressed understanding and agreed to proceed.   This note was generated in part or whole with voice recognition software. Voice recognition is usually quite accurate but there are transcription errors that can and very often do occur. I apologize for any typographical errors that were not detected and corrected.    Jeweliana Dudgeon, MD 10/03/2023, 2:08 PM

## 2023-10-09 ENCOUNTER — Ambulatory Visit (INDEPENDENT_AMBULATORY_CARE_PROVIDER_SITE_OTHER): Admitting: Licensed Clinical Social Worker

## 2023-10-09 DIAGNOSIS — Z79899 Other long term (current) drug therapy: Secondary | ICD-10-CM | POA: Diagnosis not present

## 2023-10-09 DIAGNOSIS — F902 Attention-deficit hyperactivity disorder, combined type: Secondary | ICD-10-CM | POA: Diagnosis not present

## 2023-10-09 DIAGNOSIS — F39 Unspecified mood [affective] disorder: Secondary | ICD-10-CM

## 2023-10-09 DIAGNOSIS — F70 Mild intellectual disabilities: Secondary | ICD-10-CM

## 2023-10-09 NOTE — Progress Notes (Signed)
 Comprehensive Clinical Assessment (CCA) Note  10/09/2023 [name] [id number]   Visit Diagnosis: Episodic mood disorder [facility])  Attention deficit hyperactivity disorder (ADHD), combined type  Mild intellectual disability  High risk medication use  Based on patient's presentation and reports of symptomology from patient and caregiver patient qualifies for diagnosis of episodic mood disorder and ADHD.  Patient has previously been assessed for and diagnosed with ADHD qualified evaluators. Patient qualifies for diagnosis of episodic mood disorder based on patient's significant changes in mood, energy concentration, sleep disturbances, psychomotor changes and impairment in social and occupational components of his life.  Patient has been diagnosed with bipolar disorder type I mixed moderate by his treating psychiatrist.  Clinician will continue to assess patient's symptoms and therapy.  CCA Biopsychosocial Intake/Chief Complaint:  According to the mother, the patient was under the care of [facility] and has been diagnosed with ADHD, mild intellectual disability, and an unspecified mood disorder. Despite being on medication for these conditions, the patient continues to experience episodes of anger and impulsivity. He particularly struggles with anger issues at home when he is around his family. In the past, there have been incidents where his anger resulted in damage to property at home.  Current Symptoms/Problems: The patient and Cg endorse episodes of anger and impulsivity, articularly struggling with anger issues at home when he is around his family. In the past, there have been incidents where his anger led to damage to property. He is a chronic worrier, constantly anxious, nervous, and restless. This has been ongoing for several years, and he does not believe that the current medications have effectively controlled his anxiety. Patient reports, outbursts (which he defines as  rude gestiures or unnesary comments). Shares triggers to include mornings but once he takes his medicine it improves. Cg rpeorts he gets fixated and obesses ove rone topic of conversation which he notices and it bothers him.   Patient Reported Schizophrenia/Schizoaffective Diagnosis in Past: No   Strengths: No data recorded Preferences: No data recorded Abilities: No data recorded  Type of Services Patient Feels are Needed: Individual outpatient therapy   Initial Clinical Notes/Concerns: No data recorded  Mental Health Symptoms Depression:  Irritability   Duration of Depressive symptoms: No data recorded  Mania:  Irritability; None   Anxiety:   Worrying   Psychosis:  None   Duration of Psychotic symptoms: No data recorded  Trauma:  None   Obsessions:  N/A   Compulsions:  N/A   Inattention:  Poor follow-through on tasks   Hyperactivity/Impulsivity:  Blurts out answers; Fidgets with hands/feet; Runs and climbs; Always on the go; Feeling of restlessness; Talks excessively; Several symptoms present in 2 of more settings; Symptoms present before age 30   Oppositional/Defiant Behaviors:  Angry   Emotional Irregularity:  Intense/inappropriate anger; Mood lability; Potentially harmful impulsivity   Other Mood/Personality Symptoms:  No data recorded   Mental Status Exam Appearance and self-care  Stature:  Average   Weight:  Average weight   Clothing:  Casual   Grooming:  Normal   Cosmetic use:  None   Posture/gait:  Normal   Motor activity:  Not Remarkable   Sensorium  Attention:  Distractible   Concentration:  Focuses on irrelevancies   Orientation:  X5   Recall/memory:  Defective in Immediate   Affect and Mood  Affect:  Anxious   Mood:  Irritable   Relating  Eye contact:  Fleeting   Facial expression:  Anxious   Attitude toward examiner:  Cooperative   Thought and Language  Speech flow: Clear and Coherent   Thought content:  Appropriate to  Mood and Circumstances   Preoccupation:  None   Hallucinations:  None   Organization:  No data recorded  [employer] of Knowledge:  Good   Intelligence:  Below average   Abstraction:  Concrete   Judgement:  Impaired   Reality Testing:  Variable   Insight:  Gaps   Decision Making:  Impulsive   Social Functioning  Social Maturity:  Impulsive   Social Judgement:  Heedless; Impropriety   Stress  Stressors:  Family conflict; Work   Coping Ability:  Human resources officer Deficits:  Self-control; Responsibility; Interpersonal   Supports:  Family     Religion:    Leisure/Recreation: Leisure / Recreation Do You Have Hobbies?: Yes Leisure and Hobbies: spend time with a neighbor  Exercise/Diet: Exercise/Diet Do You Have Any Trouble Sleeping?: No   CCA Employment/Education Employment/Work Situation: Employment / Work Situation Employment Situation: Employed Where is Patient Currently Employed?: [employer] How Long has Patient Been Employed?: Currently, the patient works at [employer] and [employer] four days a week, where he is able to manage his anger more effectively. Are You Satisfied With Your Job?: Yes Do You Work More Than One Job?: Yes Work Stressors: engaging with the public and doing a good job Has Patient ever Been in Equities trader?: No  Education: Education Is Patient Currently Attending School?: No Did Theme park manager?: No   CCA Family/Childhood History Family and Relationship History: Family history Marital status: Single  Childhood History:  Childhood History By whom was/is the patient raised?: Both parents Additional childhood history information: The patient was born in [place] and was adopted by his family at the age of [age]. He has an adopted sister, and his adoptive parents also have a biological daughter. The patient was born prematurely and experienced developmental delays. He graduated from high  school but attended special classes. Previously, he participated in the [employer] program; however, he did not adhere to the rules and was subsequently let go. His mother serves as his legal guardian, and he currently lives in [place] with his parents. Patient's description of current relationship with people who raised him/her: Reports his relaitonship with his father is ok. His mother brings him to all of his appointments. Does patient have siblings?: Yes Number of Siblings: 1 Description of patient's current relationship with siblings: older brother and sister, younger sister   ASAM's:  Six Dimensions of Multidimensional Assessment  Dimension 1:  Acute Intoxication and/or Withdrawal Potential:      Dimension 2:  Biomedical Conditions and Complications:      Dimension 3:  Emotional, Behavioral, or Cognitive Conditions and Complications:     Dimension 4:  Readiness to Change:     Dimension 5:  Relapse, Continued use, or Continued Problem Potential:     Dimension 6:  Recovery/Living Environment:     ASAM Severity Score:    ASAM Recommended Level of Treatment:     Substance use Disorder (SUD)    Recommendations for Services/Supports/Treatments: Recommendations for Services/Supports/Treatments Recommendations For Services/Supports/Treatments: Individual Therapy  DSM5 Diagnoses: Patient Active Problem List   Diagnosis Date Noted   Essential hypertriglyceridemia 06/21/2023   Seizure-like activity (HCC) 03/20/2022   Difficulty sleeping 03/20/2022   Elevated blood pressure reading 04/07/2021   Seizures (HCC) 03/22/2021   Mild intellectual disability 03/22/2021   At risk for prolonged QT interval syndrome 03/22/2021  High risk medication use 03/22/2021   Seizure disorder (HCC) 02/11/2018   Episodic mood disorder (HCC) 02/11/2018   Attention deficit hyperactivity disorder (ADHD), combined type 02/11/2018   [name] is a 28 year old who presents alone to establish care with  therapist and referred by psychiatrist, Dr Coby.  Patient reports a history of anger issues, ADHD ,mild intellectual disability, seizures ( last one was at the age of 29 ). Patient's mother [name] -provided collateral information-mother reported she is the legal guardian for patient. Patient today although appeared to be anxious, agitated on and off was redirectable and cooperated. Pt was oriented times 5. Pt was cooperative and engaged. Pt denies SI/HI/AVH.      Patient was adopted by his family at the age of 35.  He has an adopted sister and his adoptive parents also have a biological child-a daughter.  Patient was born premature, delayed developmentally.  Patient graduated high school, was in special classes.  Per documentation by psychiatrist: "According to mother patient was under the care of [facility], was diagnosed with ADHD, mild intellectual disability and unspecified mood disorder. Patient in spite of being on all these medications continues to have episodes of anger issues, impulsivity.  He has anger issues especially at home when he is around his family.  There has been episodes when he was so angry that there was damage to property at home, this may have been couple of years ago."  The patient tends to worry excessively and often fixates on specific changes or topics, which can lead to concerns from others. He mentions that while he was on long-acting Ritalin  for his ADHD, his moods were more stable; however, due to insurance coverage issues, his new short-acting medication is not as effective. When his anxiety levels are high, he copes by riding his bike, listening to music, and watching TV. Although he has worked with numerous therapists, he struggles to apply the skills he has learned in therapy at home. 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 The patient finds that setting alarms helps him remember and complete tasks.  He aspires to join  an exercise class at his church and feels agitated when he is bothered.  Stressors include significant changes in his routine, as he used to spend five hours at his second job but lost that position, leading to feelings of boredom. He has expressed a desire to find another job for his afternoons. His caregiver reports that he was very close to his grandfather, who passed away two years ago.  The patient has developed an association between vomiting and his medications, which has impacted his medication compliance. He dislikes appointments, as they tend to trigger feelings of anger. He also has difficulty accepting "no" as an answer. His parents are trying to find a group home for him, which is a source of stress due to his fear of losing independence and the potential impact on his job.     Goal: Improve communication  Improve anger and mood. Decrease argumentative behavior.    Patient Centered Plan: Patient is on the following Treatment Plan(s): Anger management and social interpersonal effectiveness   Referrals to Alternative Service(s): Referred to Alternative Service(s):   Place:   Date:   Time:    Referred to Alternative Service(s):   Place:   Date:   Time:    Referred to Alternative Service(s):   Place:   Date:   Time:    Referred to  Alternative Service(s):   Place:   Date:   Time:      Collaboration of Care: AEB psychiatrist can access notes and cln. Will review psychiatrists' notes. Check in with the patient and will see LCSW per availability. Patient agreed with treatment recommendations.   Patient/Guardian was advised Release of Information must be obtained prior to any record release in order to collaborate their care with an outside provider. Patient/Guardian was advised if they have not already done so to contact the registration department to sign all necessary forms in order for us  to release information regarding their care.   Consent: Patient/Guardian gives verbal consent  for treatment and assignment of benefits for services provided during this visit. Patient/Guardian expressed understanding and agreed to proceed.   [name] KATHEE Husband, LCSW

## 2023-10-12 ENCOUNTER — Ambulatory Visit: Payer: Self-pay | Admitting: Psychiatry

## 2023-10-12 LAB — VALPROIC ACID LEVEL: Valproic Acid Lvl: 50 ug/mL (ref 50–100)

## 2023-10-30 ENCOUNTER — Telehealth: Payer: Self-pay

## 2023-10-30 DIAGNOSIS — F902 Attention-deficit hyperactivity disorder, combined type: Secondary | ICD-10-CM

## 2023-10-30 NOTE — Telephone Encounter (Signed)
 Mother of patient called requesting a refill of methylphenidate  (RITALIN ) 10 MG tablet  She stated that he will be out of medication by the end of the week please advise   Last visit 10-02-23 Next visit 11-13-23   [facility] [address] [place]), [place] - [address] Phone: [phone]  Fax: [phone]

## 2023-10-31 MED ORDER — METHYLPHENIDATE HCL 10 MG PO TABS: 30.0000 mg | ORAL_TABLET | ORAL | 0 refills | Status: DC

## 2023-10-31 NOTE — Telephone Encounter (Signed)
 I have sent methylphenidate  as requested to pharmacy.

## 2023-10-31 NOTE — Addendum Note (Signed)
 Addended byBETHA COBY HEIGHT on: 10/31/2023 09:43 AM   Modules accepted: Orders

## 2023-11-13 ENCOUNTER — Telehealth: Admitting: Psychiatry

## 2023-11-20 ENCOUNTER — Ambulatory Visit (INDEPENDENT_AMBULATORY_CARE_PROVIDER_SITE_OTHER): Admitting: Licensed Clinical Social Worker

## 2023-11-20 DIAGNOSIS — F39 Unspecified mood [affective] disorder: Secondary | ICD-10-CM

## 2023-11-20 DIAGNOSIS — F902 Attention-deficit hyperactivity disorder, combined type: Secondary | ICD-10-CM | POA: Diagnosis not present

## 2023-11-20 DIAGNOSIS — F70 Mild intellectual disabilities: Secondary | ICD-10-CM | POA: Diagnosis not present

## 2023-11-20 NOTE — Progress Notes (Signed)
 THERAPIST PROGRESS NOTE  Session Time: 9:01-10am  Participation Level: Active  Behavioral Response: CasualAlertEuthymic  Type of Therapy: Individual Therapy  Treatment Goals addressed:  Active     Anger Management     LTG: [name] will reduce the amount of anger-related incidents/outbursts by 50% as evidenced by self-report     Start:  10/09/23    Expected End:  01/08/24         STG: [name] will identify situations, thoughts, and feelings that trigger internal anger, and/or angry/aggressive actions as evidenced by self-report     Start:  10/09/23    Expected End:  01/08/24         Educate [name] on anger management skills and the rationale for learning these skills     Start:  10/09/23         Educate [name] on relaxation techniques and the rationale for learning these techniques     Start:  10/09/23         Educate [name] on trigger thoughts (thoughts that trigger an anger response)     Start:  10/09/23           Social Interpersonal Effectiveness     LTG: [name] will recognize socially inappropriate behaviors and develop alternative behaviors     Start:  10/09/23    Expected End:  01/08/24         STG: Improve Relationships: Develop more effective communication and relationship skills, measured through self-reports and CG reports.     Start:  10/09/23    Expected End:  01/08/24         Encourage and recognize when [name] displays appropriate boundaries and behaviors     Start:  10/09/23         Educate [name] on appropriate behaviors and boundaries     Start:  10/09/23         Facilitate discussions with [name] regarding miscommunications that occur during social interactions     Start:  10/09/23            ProgressTowards Goals: Progressing  Interventions: Assertiveness Training, Supportive, and Anger Management Training  Summary: [name] is a 28 year old who reports a history of anger issues, ADHD ,mild intellectual disability, seizures  ( last one was at the age of 66 ). Patient's mother [name] -provided collateral information-mother reported she is the legal guardian for patient. Pt was oriented times 5. Pt was cooperative and engaged. Pt denies SI/HI/AVH.   The patient attended the session with his mother. The clinician briefly checked in with both the patient and his caregiver at the start of the session. The patient acknowledged that he is still working on managing his anger, which he notices tends to be more pronounced when his medications are wearing off. The caregiver confirmed that the patient often feels antsy and irritated before any appointments, as he frequently stresses about the journey and arriving on time.  The patient reported progress in his ability to engage in independent tasks and to assist at home. He also mentioned that he is actively involved in vocational rehabilitation while searching for a new job. The caregiver expressed concern about the patient's difficulty in remaining present, citing that he often feels anxious about uncontrollable factors, such as the weather, which affects his ability to ride his bike. The caregiver agreed with the patient's observations about his improved mood and increased cooperation but noted that these changes might be related to adjustments in his medications.  The caregiver reflected on  an upcoming family vacation, which is an annual tradition. The clinician observed increased tension as the patient and caregiver discussed past incidents at the vacation property that led to the necessity of certain guidelines for the patient when using the property's facilities. Both the caregiver and the patient noted that he has struggled to understand social cues and appropriate behavior in public spaces within the [employer]. Consequently, management has requested that the patient be escorted and monitored by an adult, which frustrates him. The clinician worked with both the caregiver and the  patient to enhance their understanding and communication regarding this matter. The clinician noticed the patient becoming increasingly agitated, as indicated by an elevated tone of voice, a reddened face, hand waving, and telling his mother to stop talking.  During the session, the clinician evaluated the patient's recent stressors. The patient became flustered while discussing his frustrations about finding a new job. He expressed that he feels watching TV and sitting at home is not a productive use of his time and is interested in working more hours.  The clinician revisited the new guidelines set by the hotel management with the patient. The patient indicated that he finds it easier to communicate about this issue with his father. The clinician collaborated with the patient to create a social story that would help him understand the rules for the upcoming vacation. Copies of this story were provided to both the patient and caregiver for further discussion at home. By the end of the session, the clinician observed that the patient had a better understanding of factors that he can control, recognizing that he cannot change others' perceptions or requests and needs to adhere to the established rules.  Suicidal/Homicidal: Nowithout intent/plan  Therapist Response: Cln utilized active and supportive reflection to create a safe space for the patient to process recent life events. Cln assessed for current stressors, symptoms, and safety sine the last session.  Clinician worked with patient and caregiver to improve communication.  Worked with the patient to understand healthier ways to express himself.  Explored ways patient engages in coping when feeling angry or frustrated.  Assess for patient's understanding of anger.  Worked with patient to construct a social story to help patient understand rules for upcoming trip.  Plan: Return again in 2 weeks.  Diagnosis: Episodic mood disorder  Attention deficit  hyperactivity disorder (ADHD), combined type  Mild intellectual disability   Collaboration of Care: AEB psychiatrist can access notes and cln. Will review psychiatrists' notes. Check in with the patient and will see LCSW per availability. Patient agreed with treatment recommendations.   Patient/Guardian was advised Release of Information must be obtained prior to any record release in order to collaborate their care with an outside provider. Patient/Guardian was advised if they have not already done so to contact the registration department to sign all necessary forms in order for us  to release information regarding their care.   Consent: Patient/Guardian gives verbal consent for treatment and assignment of benefits for services provided during this visit. Patient/Guardian expressed understanding and agreed to proceed.   [name] KATHEE Husband, LCSW 11/20/2023

## 2023-11-26 ENCOUNTER — Telehealth: Payer: Self-pay | Admitting: Licensed Clinical Social Worker

## 2023-11-26 NOTE — Telephone Encounter (Signed)
 Received request for medical records for therapy. Request faxed to [facility] at [phone]. Request and confirmation scanned.

## 2023-11-27 ENCOUNTER — Other Ambulatory Visit: Payer: Self-pay | Admitting: Psychiatry

## 2023-11-27 DIAGNOSIS — F3162 Bipolar disorder, current episode mixed, moderate: Secondary | ICD-10-CM

## 2023-11-29 ENCOUNTER — Telehealth: Payer: Self-pay

## 2023-11-29 DIAGNOSIS — F902 Attention-deficit hyperactivity disorder, combined type: Secondary | ICD-10-CM

## 2023-11-29 MED ORDER — METHYLPHENIDATE HCL 10 MG PO TABS
30.0000 mg | ORAL_TABLET | ORAL | 0 refills | Status: DC
Start: 2023-11-30 — End: 2023-12-10

## 2023-11-29 NOTE — Telephone Encounter (Signed)
 Medication refill - Call from pt's Mother reporting patient was in need of a new methlyphenidate 10 mg order, last provided 11/01/23 for a 30 day order. Collateral requests this be sent to pt's [facility]. Pt. returns 12/10/23.

## 2023-11-29 NOTE — Telephone Encounter (Signed)
 I have sent methylphenidate  to pharmacy as requested.

## 2023-12-04 ENCOUNTER — Ambulatory Visit (INDEPENDENT_AMBULATORY_CARE_PROVIDER_SITE_OTHER): Admitting: Licensed Clinical Social Worker

## 2023-12-04 DIAGNOSIS — F902 Attention-deficit hyperactivity disorder, combined type: Secondary | ICD-10-CM | POA: Diagnosis not present

## 2023-12-04 DIAGNOSIS — F70 Mild intellectual disabilities: Secondary | ICD-10-CM

## 2023-12-04 DIAGNOSIS — F3162 Bipolar disorder, current episode mixed, moderate: Secondary | ICD-10-CM | POA: Diagnosis not present

## 2023-12-04 NOTE — Progress Notes (Signed)
 THERAPIST PROGRESS NOTE  Session Time: 10:12am-11am  Participation Level: Active  Behavioral Response: CasualAlertIrritable  Type of Therapy: Individual Therapy  Treatment Goals addressed:  Active     Anger Management     LTG: [name] will reduce the amount of anger-related incidents/outbursts by 50% as evidenced by self-report     Start:  10/09/23    Expected End:  01/08/24         STG: [name] will identify situations, thoughts, and feelings that trigger internal anger, and/or angry/aggressive actions as evidenced by self-report     Start:  10/09/23    Expected End:  01/08/24         Educate [name] on anger management skills and the rationale for learning these skills     Start:  10/09/23         Educate [name] on relaxation techniques and the rationale for learning these techniques     Start:  10/09/23         Educate [name] on trigger thoughts (thoughts that trigger an anger response)     Start:  10/09/23           Social Interpersonal Effectiveness     LTG: [name] will recognize socially inappropriate behaviors and develop alternative behaviors     Start:  10/09/23    Expected End:  01/08/24         STG: Improve Relationships: Develop more effective communication and relationship skills, measured through self-reports and CG reports.     Start:  10/09/23    Expected End:  01/08/24         Encourage and recognize when [name] displays appropriate boundaries and behaviors     Start:  10/09/23         Educate [name] on appropriate behaviors and boundaries     Start:  10/09/23         Facilitate discussions with [name] regarding miscommunications that occur during social interactions     Start:  10/09/23            ProgressTowards Goals: Progressing  Interventions: Solution Focused, Supportive, and Other: Mindfulness  Summary:[name] is a 28 year old who reports a history of anger issues, ADHD ,mild intellectual disability, seizures ( last  one was at the age of 76 ). Patient's mother [name] -provided collateral information-mother reported she is the legal guardian for patient. Pt was oriented times 5. Pt was cooperative and engaged. Pt denies SI/HI/AVH.  Clinician notes improvements with disruptions while his mother was engaging in session and outward expression of irritability.  However, Patient continues to demonstrate agitation in session.  Patient also presented as hyperverbal in session and needed redirection multiple times.  Patient was accompanied to session by his mother who engaged for the first 15 minutes.  Caregiver reports the last week has been bad as the patient has not been sleeping as well, which has led to increased irritability.  CG reflected on specific presentations of argumentative tendencies with his mother stating he is often in my face.  CG reports this irritability is worse in the morning time, when the weather is bad, and she believes this behavior to be attention seeking.  Caregiver identifies this behavior has worsened within the past 2 weeks which coincides with a change in his medications.  Patient denies stating he does not feel changes to his medication need to be made and/or changes to his medication regimen add stress.  Caregiver reports she plans to contact the patient's psychiatrist to discuss  patient's irritability and medication changes.  Clinician met with the patient to explore his understanding of boundaries with upcoming family vacation specifically following the construction of his social story in the last session.  The patient reflected on a recent conversation with his father where a verbal contract was constructed for patient's behaviors while on resort property per request of management.  Patient in his own words described the opportunity to rebuild trust with his family and property management and ways in which he can gain trust in an effort to appease management for next year's vacation.  Patient  also expressed an improved understanding of social standards and ways in which he can practice improved patients with his family this year.  Clinician assessed for use of coping skills to manage his irritability/anger.  Patient reports he finds it helpful to listen to music and watch TV when upset.  Clinician challenged patient to identify alternative perspectives as music and television may not always be accessible.  Patient reports he finds it helpful to engage in deep breathing however, demonstrated difficulty demonstrating the skill for the clinician.  Therefore, clinician educated patient on grounding techniques to address his anger such as 7-11 breathing, belly breathing, box breathing, and the 5 senses grounding activity.  The patient was provided with documentation on the skills with imagery and written instruction to carry with him on his trip in reference for homework and practicing the mindfulness techniques.   Suicidal/Homicidal: Nowithout intent/plan  Therapist Response: Cln utilized active and supportive reflection to create a safe space for the patient to process recent life events. Cln assessed for current stressors, symptoms, and safety sine the last session.  Clinician continue to work with patient and caregiver on reinforcing social standards and boundaries established by others.  Continue to assess patient's understanding of socially appropriate behavior.  Assess for and evaluated patient's understanding of coping skills to manage aggressive tendencies.  Educated patient on mindfulness techniques to practice in an effort to manage and cope with irritability.  Plan: Return again in 2 weeks.  Diagnosis: Bipolar 1 disorder, mixed, moderate (HCC)  Attention deficit hyperactivity disorder (ADHD), combined type  Mild intellectual disability   Collaboration of Care: AEB psychiatrist can access notes and cln. Will review psychiatrists' notes. Check in with the patient and will see LCSW  per availability. Patient agreed with treatment recommendations.   Patient/Guardian was advised Release of Information must be obtained prior to any record release in order to collaborate their care with an outside provider. Patient/Guardian was advised if they have not already done so to contact the registration department to sign all necessary forms in order for us  to release information regarding their care.   Consent: Patient/Guardian gives verbal consent for treatment and assignment of benefits for services provided during this visit. Patient/Guardian expressed understanding and agreed to proceed.   [name] KATHEE Husband, LCSW 12/04/2023

## 2023-12-10 ENCOUNTER — Encounter: Payer: Self-pay | Admitting: Psychiatry

## 2023-12-10 ENCOUNTER — Telehealth: Admitting: Psychiatry

## 2023-12-10 DIAGNOSIS — F3177 Bipolar disorder, in partial remission, most recent episode mixed: Secondary | ICD-10-CM | POA: Diagnosis not present

## 2023-12-10 DIAGNOSIS — F902 Attention-deficit hyperactivity disorder, combined type: Secondary | ICD-10-CM | POA: Diagnosis not present

## 2023-12-10 DIAGNOSIS — F70 Mild intellectual disabilities: Secondary | ICD-10-CM | POA: Diagnosis not present

## 2023-12-10 MED ORDER — METHYLPHENIDATE HCL 10 MG PO TABS: 30.0000 mg | ORAL_TABLET | ORAL | 0 refills | Status: DC

## 2023-12-10 NOTE — Progress Notes (Unsigned)
 Virtual Visit via Video Note  I connected with [name] on 12/10/23 at  1:00 PM EST by a video enabled telemedicine application and verified that I am speaking with the correct person using two identifiers.  Location Provider Location : [facility] Patient Location : Home  Participants: Patient ,Mother, Provider    I discussed the limitations of evaluation and management by telemedicine and the availability of in person appointments. The patient expressed understanding and agreed to proceed.   I discussed the assessment and treatment plan with the patient. The patient was provided an opportunity to ask questions and all were answered. The patient agreed with the plan and demonstrated an understanding of the instructions.   The patient was advised to call back or seek an in-person evaluation if the symptoms worsen or if the condition fails to improve as anticipated.                                                                                    BH MD OP Progress Note  12/10/2023 1:24 PM [name]  MRN:  [id number]  Chief Complaint:  Chief Complaint  Patient presents with   Follow-up   Anxiety   Depression   Medication Refill   ADD   Discussed the use of AI scribe software for clinical note transcription with the patient, who gave verbal consent to proceed.  History of Present Illness [name] is a 28 year old Caucasian male, single, lives in [place], has a history of ADHD, mood disorder, mild intellectual disability, history of seizure disorder was evaluated by telemedicine today.  He is accompanied by his mother who is his legal guardian.  Since the last appointment in August, his mother reports that his mood and behavior have generally improved, particularly after an increase in his Depakote  dosage. Over the past week, however, she notes increased irritability and verbal outbursts, especially in anticipation of an upcoming beach trip and in response to rules  requiring supervision at the pool. She explains that excitement around Halloween and the upcoming trip may contribute to this recent agitation. She describes that he sometimes allows others to provoke him, which leads to anger, and that he struggles to follow rules in public settings, resulting in incidents that have required intervention by motel management.  He and his mother report that he is currently taking Depakote , and his mother notes that his most recent lab level was therapeutic. Additionally, his mother states that nortriptyline was added on October 7th for light sensitivity by neurologist Dr.Potter, and he has taken it for almost 1 month. He is considering whether nortriptyline may contribute to his recent irritability and agitation.  He continues to be compliant on his other medications as prescribed and denies any side effects.  He reports seeing a therapist, Ms. [name], every 2 weeks and has attended 3 sessions so far. He describes practicing breathing exercises that his therapist provided to help manage anxiety and agitation, and his mother notes that these techniques have helped him calm during anxious situations.  He reports that his sleep is generally adequate, though changes in daylight saving time disrupt his sleep schedule. He describes waking up at different times during the  night and morning but does not report significant ongoing sleep difficulties.  When asked directly, he denies any thoughts of hurting himself or others, stating that he is not planning on it and does not intend to act on such thoughts.  As per mother she is agreeable to giving it more time with therapy and just monitoring his symptoms rather than making further medication changes.   Visit Diagnosis:    ICD-10-CM   1. Bipolar disorder, in partial remission, most recent episode mixed (HCC)  F31.77     2. Attention deficit hyperactivity disorder (ADHD), combined type  F90.2 methylphenidate  (RITALIN ) 10 MG  tablet    3. Mild intellectual disability  F70       Past Psychiatric History: I have reviewed past psychiatric history from progress note on 03/22/2021.  I have also reviewed progress note from 04/17/2021.  Past Medical History:  Past Medical History:  Diagnosis Date   ADHD    Intellectual disability    Seizures (HCC)    last seizure was at the age of 5   History reviewed. No pertinent surgical history.  Family Psychiatric History: I have reviewed family psychiatric history from progress note on 03/22/2021.  Family History:  Family History  Problem Relation Age of Onset   Mental illness Neg Hx     Social History: I have reviewed social history from progress note on 03/22/2021. Social History   Socioeconomic History   Marital status: Single    Spouse name: Not on file   Number of children: Not on file   Years of education: Not on file   Highest education level: Not on file  Occupational History   Not on file  Tobacco Use   Smoking status: Never    Passive exposure: Never   Smokeless tobacco: Not on file  Vaping Use   Vaping status: Never Used  Substance and Sexual Activity   Alcohol use: No   Drug use: Never   Sexual activity: Not Currently  Other Topics Concern   Not on file  Social History Narrative   PATIENT HAS LEGAL GUARDIAN - [name],[name] and [name] ( parents )    Social Drivers of Corporate Investment Banker Strain: Patient Declined (05/14/2023)   Received from [facility]   Overall Financial Resource Strain (CARDIA)    Difficulty of Paying Living Expenses: Patient declined  Food Insecurity: Patient Declined (05/14/2023)   [facility]   Hunger Vital Sign    Within the past 12 months, you worried that your food would run out before you got the money to buy more.: Patient declined    Within the past 12 months, the food you bought just didn't last and you didn't have money to get more.: Patient declined   Transportation Needs: Patient Declined (05/14/2023)   Received from [facility] - Transportation    In the past 12 months, has lack of transportation kept you from medical appointments or from getting medications?: Patient declined    Lack of Transportation (Non-Medical): Patient declined  Physical Activity: Not on file  Stress: Not on file  Social Connections: Not on file    Allergies:  Allergies  Allergen Reactions   Bee Pollen Shortness Of Breath   Dust Mite Extract Other (See Comments)    Congestion and occasional itching of eyes.   Gramineae Pollens Other (See Comments)    Congestion    Metabolic Disorder Labs: No results found for:  HGBA1C, MPG Lab Results  Component Value Date   PROLACTIN 3.2 (L) 03/25/2021   No results found for: CHOL, TRIG, HDL, CHOLHDL, VLDL, LDLCALC Lab Results  Component Value Date   TSH 2.780 03/20/2022   TSH 5.730 (H) 01/24/2022    Therapeutic Level Labs: Lab Results  Component Value Date   LITHIUM  0.5 05/30/2022   LITHIUM  0.6 01/24/2022   Lab Results  Component Value Date   VALPROATE 50 10/11/2023   VALPROATE 45 (L) 09/06/2023   No results found for: CBMZ  Current Medications: Current Outpatient Medications  Medication Sig Dispense Refill   nortriptyline (PAMELOR) 10 MG capsule Take 10 mg by mouth at bedtime.     divalproex  (DEPAKOTE ) 250 MG DR tablet TAKE 1 TABLET BY MOUTH IN THE MORNING, 2 TABLETS AT 2PM, AND 2 TABLETS AT BEDTIME. 150 tablet 2   escitalopram  (LEXAPRO ) 20 MG tablet Take 1 tablet (20 mg total) by mouth daily. 90 tablet 3   lamoTRIgine  (LAMICTAL ) 100 MG tablet Take 1 tablet (100 mg total) by mouth daily. Take along with 25 mg daily- THIS MEDICATION IS CURRENTLY BEING PRESCRIBED FOR SEIZURES BY NEUROLOGY 90 tablet 0   lamoTRIgine  (LAMICTAL ) 25 MG tablet Take 1 tablet (25 mg total) by mouth daily. Take along with 100 mg daily- THIS MEDICATION IS BEING PRESCRIBED BY NEUROLOGY  90 tablet 0   [START ON 12/27/2023] methylphenidate  (RITALIN ) 10 MG tablet Take 3 tablets (30 mg total) by mouth as directed. Take 2 tablets daily at 7:30 AM and 1 tablet daily at 11:30 AM 90 tablet 0   promethazine-dextromethorphan (PROMETHAZINE-DM) 6.25-15 MG/5ML syrup Take by mouth.     QUEtiapine  (SEROQUEL  XR) 200 MG 24 hr tablet Take 1 tablet (200 mg total) by mouth at bedtime. 90 tablet 1   traZODone  (DESYREL ) 50 MG tablet Take 1 tablet (50 mg total) by mouth at bedtime as needed for sleep.     No current facility-administered medications for this visit.     Musculoskeletal: Strength & Muscle Tone: UTA Gait & Station: Seated Patient leans: N/A  Psychiatric Specialty Exam: Review of Systems  Psychiatric/Behavioral:  The patient is nervous/anxious.        Irritable    There were no vitals taken for this visit.There is no height or weight on file to calculate BMI.  General Appearance: Casual  Eye Contact:  Fair  Speech:  Clear and Coherent  Volume:  Normal  Mood:  Anxious and Irritable  Affect:  Congruent  Thought Process:  Goal Directed and Descriptions of Associations: Intact  Orientation:  Full (Time, Place, and Person)  Thought Content: Logical   Suicidal Thoughts:  No  Homicidal Thoughts:  No  Memory:  Immediate;   Fair Recent;   Fair Remote;   Poor  Judgement:  Other:  Limited  Insight:  Shallow  Psychomotor Activity:  Normal  Concentration:  Concentration: Poor and Attention Span: Poor  Recall:  Poor  Fund of Knowledge: Fair  Language: Fair  Akathisia:  No  Handed:  Right  AIMS (if indicated): not done  Assets:  Manufacturing Systems Engineer Desire for Improvement Housing Social Support Transportation  ADL's:  Intact  Cognition: baseline  Sleep:  Improving   Screenings: Geneticist, Molecular Office Visit from 08/27/2023 in [facility] Office Visit from 07/30/2023 in [facility]  Office Visit from 05/15/2023 in [facility] Office Visit from 03/06/2023 in [facility] Office Visit from 09/26/2022 in [facility]  AIMS Total Score 0 0 0 0 0   GAD-7    Flowsheet Row Counselor from 10/09/2023 in [facility] Office Visit from 08/27/2023 in [facility] Office Visit from 09/26/2022 in [facility] Office Visit from 05/30/2022 in [facility] Office Visit from 03/16/2022 in [facility]  Total GAD-7 Score 19 17 18 16 19    PHQ2-9    Flowsheet Row Counselor from 10/09/2023 in [facility] Office Visit from 08/27/2023 in [facility] Office Visit from 09/26/2022 in [facility] Office Visit from 05/30/2022 in [facility] Office Visit from 03/16/2022 in [facility]  PHQ-2 Total Score 0 0 0 0 0  PHQ-9 Total Score 2 9 -- 9 5   Flowsheet Row Video Visit from 12/10/2023 in [facility] Counselor from 10/09/2023 in [facility] Video Visit from 10/02/2023 in [facility]  C-SSRS RISK CATEGORY No Risk No Risk No Risk     Assessment and Plan: Sequoia Mincey is a 28 year old Caucasian male, who presented for a follow-up appointment discussed assessment and plan as noted below.  1. Bipolar 1 disorder, mixed, moderate (HCC) in partial remission Patient is currently improving with the increased dosage of medications as well as psychotherapy sessions. Continue Depakote  ER 1250 mg  daily Continue Seroquel  XR 200 mg at bedtime Continue Lamictal  125 mg daily Continue Lexapro  20 mg daily Continue Trazodone  50 mg at bedtime as needed Continue psychotherapy sessions with Ms. [name] Husband  2. Attention deficit hyperactivity disorder (ADHD), combined type-improving Currently reports attention and focus is better. Continue Methylphenidate  30 mg daily in divided dosage Reviewed [place] PMP AWARxE   3. Mild intellectual disability-chronic Continues to have good social support system. Will reevaluate in future sessions.  Reviewed and discussed labs dated 10/11/2023-valproic acid  level-50-therapeutic  Collateral information obtained from mother/legal guardian.  Follow-up Follow-up in clinic in 1 month or sooner if needed.    Collaboration of Care: Collaboration of Care: Referral or follow-up with counselor/therapist AEB patient encouraged to continue psychotherapy sessions.  Patient/Guardian was advised Release of Information must be obtained prior to any record release in order to collaborate their care with an outside provider. Patient/Guardian was advised if they have not already done so to contact the registration department to sign all necessary forms in order for us  to release information regarding their care.   Consent: Patient/Guardian gives verbal consent for treatment and assignment of benefits for services provided during this visit. Patient/Guardian expressed understanding and agreed to proceed.   This note was generated in part or whole with voice recognition software. Voice recognition is usually quite accurate but there are transcription errors that can and very often do occur. I apologize for any typographical errors that were not detected and corrected.    Glenetta Kiger, MD 12/11/2023, 9:28 AM

## 2023-12-11 ENCOUNTER — Encounter: Payer: Self-pay | Admitting: Licensed Clinical Social Worker

## 2023-12-24 ENCOUNTER — Other Ambulatory Visit: Payer: Self-pay | Admitting: Psychiatry

## 2023-12-24 DIAGNOSIS — F39 Unspecified mood [affective] disorder: Secondary | ICD-10-CM

## 2023-12-25 ENCOUNTER — Ambulatory Visit: Admitting: Licensed Clinical Social Worker

## 2024-01-08 ENCOUNTER — Ambulatory Visit: Admitting: Licensed Clinical Social Worker

## 2024-01-22 ENCOUNTER — Ambulatory Visit: Admitting: Licensed Clinical Social Worker

## 2024-01-28 ENCOUNTER — Telehealth (INDEPENDENT_AMBULATORY_CARE_PROVIDER_SITE_OTHER): Admitting: Psychiatry

## 2024-01-28 ENCOUNTER — Encounter: Payer: Self-pay | Admitting: Psychiatry

## 2024-01-28 DIAGNOSIS — F3178 Bipolar disorder, in full remission, most recent episode mixed: Secondary | ICD-10-CM | POA: Diagnosis not present

## 2024-01-28 DIAGNOSIS — F70 Mild intellectual disabilities: Secondary | ICD-10-CM

## 2024-01-28 DIAGNOSIS — F902 Attention-deficit hyperactivity disorder, combined type: Secondary | ICD-10-CM

## 2024-01-28 MED ORDER — METHYLPHENIDATE HCL ER 20 MG PO TBCR: 20.0000 mg | EXTENDED_RELEASE_TABLET | Freq: Every day | ORAL | 0 refills | Status: DC

## 2024-01-28 MED ORDER — METHYLPHENIDATE HCL ER 10 MG PO TBCR: 10.0000 mg | EXTENDED_RELEASE_TABLET | Freq: Every day | ORAL | 0 refills | Status: DC

## 2024-01-28 NOTE — Progress Notes (Unsigned)
 Virtual Visit via Video Note  I connected with [name] on 01/28/2024 at  3:00 PM EST by a video enabled telemedicine application and verified that I am speaking with the correct person using two identifiers.  Location Provider Location : [facility] Patient Location : Home  Participants: Patient , Mother,Provider   I discussed the limitations of evaluation and management by telemedicine and the availability of in person appointments. The patient expressed understanding and agreed to proceed.   I discussed the assessment and treatment plan with the patient. The patient was provided an opportunity to ask questions and all were answered. The patient agreed with the plan and demonstrated an understanding of the instructions.   The patient was advised to call back or seek an in-person evaluation if the symptoms worsen or if the condition fails to improve as anticipated.  BH MD OP Progress Note  01/28/2024 3:27 PM Malcom Selmer  MRN:  [id number]  Chief Complaint:  Chief Complaint  Patient presents with   Follow-up   Anxiety   Depression   Medication Refill   Discussed the use of AI scribe software for clinical note transcription with the patient, who gave verbal consent to proceed.  History of Present Illness [name] is a 28 year old Caucasian male, single, lives in [place], has a history of ADHD, mood disorder, mild intellectual disability, history of seizure disorder was evaluated by telemedicine today.  His mother reports that he continues to experience periods of stubbornness and occasional behavioral outbursts, including a recent incident where he used inappropriate language and gestures toward her after she told him he could not go outside. He confirms feeling upset when his internet access was restricted as a consequence of his behavior. He describes using coping strategies provided by his therapist, such as breathing exercises, and finds these helpful in managing his  emotions.  Both he and his mother state that he currently takes methylphenidate  immediate release, 30 mg daily (2 tablets in the morning and 1 at lunch), as insurance no longer covers the long-acting formulation. He reports that the long-acting version provided greater benefit, but he tolerates the current regimen. His mother notes no recent changes to his Lamictal  dosage and states that his other medications continue to work well. He also describes ongoing work with his therapist, who provides assignments to help him manage his mood and behavior.  He reports sleep is overall good.  He also notes a recent change in his work schedule, now working Monday through Friday from 9 to 11 AM, which has required adjustments to his routine.He denies any thoughts of harming himself or others.     Visit Diagnosis:    ICD-10-CM   1. Bipolar disorder, in full remission, most recent episode mixed  F31.78     2. Attention deficit hyperactivity disorder (ADHD), combined type  F90.2 methylphenidate  (METADATE  ER) 20 MG ER tablet    methylphenidate  10 MG ER tablet    3. Mild intellectual disability  F70       Past Psychiatric History: I have reviewed past psychiatric history from progress note on 03/22/2021.  I have reviewed progress note from 04/17/2021.  Past Medical History:  Past Medical History:  Diagnosis Date   ADHD    Intellectual disability    Seizures (HCC)    last seizure was at the age of 5   History reviewed. No pertinent surgical history.  Family Psychiatric History: I have reviewed family psychiatric history from progress note on 03/22/2021.  Family History:  Family  History  Problem Relation Age of Onset   Mental illness Neg Hx     Social History: I have reviewed social history from progress note on 03/22/2021. Social History   Socioeconomic History   Marital status: Single    Spouse name: Not on file   Number of children: Not on file   Years of education: Not on file   Highest  education level: Not on file  Occupational History   Not on file  Tobacco Use   Smoking status: Never    Passive exposure: Never   Smokeless tobacco: Not on file  Vaping Use   Vaping status: Never Used  Substance and Sexual Activity   Alcohol use: No   Drug use: Never   Sexual activity: Not Currently  Other Topics Concern   Not on file  Social History Narrative   PATIENT HAS LEGAL GUARDIAN - [name],[name] and [name] ( parents )    Social Drivers of Health   Tobacco Use: Unknown (01/28/2024)   Patient History    Smoking Tobacco Use: Never    Smokeless Tobacco Use: Unknown    Passive Exposure: Never  Financial Resource Strain: Patient Declined (05/14/2023)   Received from [facility]   Overall Financial Resource Strain (CARDIA)    Difficulty of Paying Living Expenses: Patient declined  Food Insecurity: Patient Declined (05/14/2023)   Received from [facility]   Epic    Within the past 12 months, you worried that your food would run out before you got the money to buy more.: Patient declined    Within the past 12 months, the food you bought just didn't last and you didn't have money to get more.: Patient declined  Transportation Needs: Patient Declined (05/14/2023)   Received from [facility] - Transportation    In the past 12 months, has lack of transportation kept you from medical appointments or from getting medications?: Patient declined    Lack of Transportation (Non-Medical): Patient declined  Physical Activity: Not on file  Stress: Not on file  Social Connections: Not on file  Depression (PHQ2-9): Low Risk (10/09/2023)   Depression (PHQ2-9)    PHQ-2 Score: 2  Recent Concern: Depression (PHQ2-9) - Medium Risk (08/27/2023)   Depression (PHQ2-9)    PHQ-2 Score: 9  Alcohol Screen: Not on file  Housing: Patient Declined (05/14/2023)   Received from [facility]   Epic    In the last 12 months, was  there a time when you were not able to pay the mortgage or rent on time?: Patient declined    In the past 12 months, how many times have you moved where you were living?: 0    At any time in the past 12 months, were you homeless or living in a shelter (including now)?: Patient declined  Utilities: Patient Declined (05/14/2023)   Received from [facility] Utilities    Threatened with loss of utilities: Patient declined  Health Literacy: Not on file    Allergies: Allergies[1]  Metabolic Disorder Labs: No results found for: HGBA1C, MPG Lab Results  Component Value Date   PROLACTIN 3.2 (L) 03/25/2021   No results found for: CHOL, TRIG, HDL, CHOLHDL, VLDL, LDLCALC Lab Results  Component Value Date   TSH 2.780 03/20/2022   TSH 5.730 (H) 01/24/2022    Therapeutic Level Labs: Lab Results  Component Value Date   LITHIUM  0.5 05/30/2022   LITHIUM  0.6  01/24/2022   Lab Results  Component Value Date   VALPROATE 50 10/11/2023   VALPROATE 45 (L) 09/06/2023   No results found for: CBMZ  Current Medications: Current Outpatient Medications  Medication Sig Dispense Refill   methylphenidate  (METADATE  ER) 20 MG ER tablet Take 1 tablet (20 mg total) by mouth daily. Take along with Metadate  10 mg 30 tablet 0   methylphenidate  10 MG ER tablet Take 1 tablet (10 mg total) by mouth daily. Take along with 20 mg daily 30 tablet 0   divalproex  (DEPAKOTE ) 250 MG DR tablet TAKE 1 TABLET BY MOUTH IN THE MORNING, 2 TABLETS AT 2PM, AND 2 TABLETS AT BEDTIME. 150 tablet 2   escitalopram  (LEXAPRO ) 20 MG tablet Take 1 tablet (20 mg total) by mouth daily. 90 tablet 3   lamoTRIgine  (LAMICTAL ) 100 MG tablet Take 1 tablet (100 mg total) by mouth daily. Take along with 25 mg daily- THIS MEDICATION IS CURRENTLY BEING PRESCRIBED FOR SEIZURES BY NEUROLOGY 90 tablet 0   lamoTRIgine  (LAMICTAL ) 25 MG tablet Take 1 tablet (25 mg total) by mouth daily. Take along with 100 mg daily-  THIS MEDICATION IS BEING PRESCRIBED BY NEUROLOGY 90 tablet 0   nortriptyline (PAMELOR) 10 MG capsule Take 10 mg by mouth at bedtime.     promethazine-dextromethorphan (PROMETHAZINE-DM) 6.25-15 MG/5ML syrup Take by mouth.     QUEtiapine  (SEROQUEL  XR) 200 MG 24 hr tablet Take 1 tablet (200 mg total) by mouth at bedtime. 90 tablet 1   traZODone  (DESYREL ) 50 MG tablet TAKE 1 TABLET AT BEDTIME AS NEEDED FOR SLEEP 90 tablet 3   No current facility-administered medications for this visit.     Musculoskeletal: Strength & Muscle Tone: UTA Gait & Station: Seated Patient leans: N/A  Psychiatric Specialty Exam: Review of Systems  Psychiatric/Behavioral:  The patient is nervous/anxious.     There were no vitals taken for this visit.There is no height or weight on file to calculate BMI.  General Appearance: Casual  Eye Contact:  Fair  Speech:  Clear and Coherent  Volume:  Normal  Mood:  Anxious improving  Affect:  Appropriate  Thought Process:  Goal Directed and Descriptions of Associations: Intact  Orientation:  Full (Time, Place, and Person)  Thought Content: Logical   Suicidal Thoughts:  No  Homicidal Thoughts:  No  Memory:  Immediate;   Fair Recent;   Fair Remote;   Poor  Judgement:  Other:  limited  Insight:  Fair  Psychomotor Activity:  Normal  Concentration:  Concentration: Poor and Attention Span: Poor  Recall:  Poor  Fund of Knowledge: Fair  Language: Fair  Akathisia:  No  Handed:  Right  AIMS (if indicated): not done  Assets:  Therapist, Sports  ADL's:  Intact  Cognition: baseline  Sleep:  improving   Screenings: Geneticist, Molecular Office Visit from 08/27/2023 in [facility] Office Visit from 07/30/2023 in [facility] Office Visit from 05/15/2023 in [facility] Office Visit from 03/06/2023 in [facility] Office Visit from 09/26/2022 in [facility]  AIMS Total Score 0 0 0 0 0   GAD-7    Flowsheet Row Counselor from 10/09/2023 in [facility] Office Visit from 08/27/2023 in [facility] Office Visit from 09/26/2022 in [facility] Office Visit  from 05/30/2022 in [facility] Office Visit from 03/16/2022 in [facility]  Total GAD-7 Score 19 17 18 16 19    PHQ2-9    Flowsheet Row Counselor from 10/09/2023 in [facility] Office Visit from 08/27/2023 in [facility] Office Visit from 09/26/2022 in [facility] Office Visit from 05/30/2022 in [facility] Office Visit from 03/16/2022 in [facility]  PHQ-2 Total Score 0 0 0 0 0  PHQ-9 Total Score 2 9 -- 9 5   Flowsheet Row Video Visit from 01/28/2024 in [facility] Video Visit from 12/10/2023 in [facility] Counselor from 10/09/2023 in [facility]  C-SSRS RISK CATEGORY No Risk No Risk No Risk     Assessment and Plan: [name] is a 28 year old Caucasian male, single, disabled, has a history of ADHD, episodic mood disorder, intellectual disability was evaluated by telemedicine today.  Discussed assessment and plan as noted below.  1. Bipolar disorder, in full remission, most recent episode mixed Currently with ongoing episodic behavioral issues however overall current medication regimen has been beneficial. Continue Depakote  ER 1250 mg daily in divided dosage (Depakote   level dated 10/11/2023-50-therapeutic) Continue Lexapro  20 mg daily Continue Seroquel  extended release 200 mg daily at bedtime Continue Lamictal  125 mg daily Continue Trazodone  50 mg at bedtime as needed Continue psychotherapy sessions with Ms. Perkins  2. Attention deficit hyperactivity disorder (ADHD), combined type-improving Although Ritalin  immediate relief has been beneficial interested in switching to an extended release. Changed to Metadate  30 mg daily. Reviewed [place] AWARxE   3. Mild intellectual disability-chronic Currently in psychotherapy and has good support system.    Follow-up Follow-up in clinic in 3 months or sooner if needed.  Collaboration of Care: Collaboration of Care: Referral or follow-up with counselor/therapist AEB encouraged to continue psychotherapy sessions with Ms. Perkins.  Collateral information obtained from mother who was present in session.  Patient/Guardian was advised Release of Information must be obtained prior to any record release in order to collaborate their care with an outside provider. Patient/Guardian was advised if they have not already done so to contact the registration department to sign all necessary forms in order for us  to release information regarding their care.   Consent: Patient/Guardian gives verbal consent for treatment and assignment of benefits for services provided during this visit. Patient/Guardian expressed understanding and agreed to proceed.   This note was generated in part or whole with voice recognition software. Voice recognition is usually quite accurate but there are transcription errors that can and very often do occur. I apologize for any typographical errors that were not detected and corrected.    Japheth Diekman, MD 01/29/2024, 8:17 AM     [1]  Allergies Allergen Reactions   Bee Pollen Shortness Of Breath   Dust Mite Extract Other (See Comments)    Congestion and occasional itching of eyes.    Gramineae Pollens Other (See Comments)    Congestion

## 2024-02-04 ENCOUNTER — Ambulatory Visit (INDEPENDENT_AMBULATORY_CARE_PROVIDER_SITE_OTHER): Admitting: Licensed Clinical Social Worker

## 2024-02-04 ENCOUNTER — Encounter: Payer: Self-pay | Admitting: Licensed Clinical Social Worker

## 2024-02-04 DIAGNOSIS — F902 Attention-deficit hyperactivity disorder, combined type: Secondary | ICD-10-CM | POA: Diagnosis not present

## 2024-02-04 DIAGNOSIS — F70 Mild intellectual disabilities: Secondary | ICD-10-CM | POA: Diagnosis not present

## 2024-02-04 DIAGNOSIS — F3178 Bipolar disorder, in full remission, most recent episode mixed: Secondary | ICD-10-CM

## 2024-02-04 NOTE — Progress Notes (Signed)
 "  THERAPIST PROGRESS NOTE  Virtual Visit via Video Note  I connected with [name] on 02/04/2024 at  2:00 PM EST by a video enabled telemedicine application and verified that I am speaking with the correct person using two identifiers.  Location: Patient: Address on file Provider: Providers address    I discussed the limitations of evaluation and management by telemedicine and the availability of in person appointments. The patient expressed understanding and agreed to proceed.  I discussed the assessment and treatment plan with the patient. The patient was provided an opportunity to ask questions and all were answered. The patient agreed with the plan and demonstrated an understanding of the instructions.   The patient was advised to call back or seek an in-person evaluation if the symptoms worsen or if the condition fails to improve as anticipated.  I provided 55 minutes of non-face-to-face time during this encounter.   Anthony KATHEE Husband, LCSW   Session Time: 2-2:55pm  Participation Level: Active  Behavioral Response: CasualAlertIrritable and Impatient   Type of Therapy: Family Therapy  Treatment Goals addressed:  Active     Anger Management     LTG: [name] will reduce the amount of anger-related incidents/outbursts by 50% as evidenced by self-report (Progressing)     Start:  10/09/23    Expected End:  06/04/24       Goal Note     02/04/24: Both the caregiver and patient report anger has improved as a result of changes to medications.  Caregiver and patient identified triggers for his anger to include slow-moving cars while he is riding his bike, having to wait on others, and changes to his routine specifically related to doctors appointments.  Patient was able to identify solutions to improving readiness for doctors appointments.         STG: [name] will identify situations, thoughts, and feelings that trigger internal anger, and/or angry/aggressive actions as  evidenced by self-report (Progressing)     Start:  10/09/23    Expected End:  06/04/24         Educate Anthony on anger management skills and the rationale for learning these skills     Start:  10/09/23         Educate Anthony on relaxation techniques and the rationale for learning these techniques     Start:  10/09/23         Educate [name] on trigger thoughts (thoughts that trigger an anger response)     Start:  10/09/23           Social Interpersonal Effectiveness     LTG: [name] will recognize socially inappropriate behaviors and develop alternative behaviors (Not Progressing)     Start:  10/09/23    Expected End:  06/04/24         STG: Improve Relationships: Develop more effective communication and relationship skills, measured through self-reports and CG reports. (Not Progressing)     Start:  10/09/23    Expected End:  06/04/24       Goal Note     02/04/24: Clinician continues to observe patient engaging in behaviors representative of feelings of frustration related to communication with his mother.  Clinician also observes patient asking his mother to refrain from communicating sometimes in a nonpolite manner.         Encourage and recognize when [name] displays appropriate boundaries and behaviors     Start:  10/09/23         Educate Anthony on appropriate behaviors  and boundaries     Start:  10/09/23         Facilitate discussions with Anthony regarding miscommunications that occur during social interactions     Start:  10/09/23            ProgressTowards Goals: Progressing  Interventions: Solution Focused, Strength-based, Supportive, and Anger Management Training  Summary: :[name] is a 28 year old who reports a history of anger issues, ADHD ,mild intellectual disability, seizures ( last one was at the age of 48 ). Patient's mother [name] -provided collateral information-mother reported she is the legal guardian for patient. Pt was oriented times 5.  Pt was cooperative and engaged. Pt denies SI/HI/AVH.  Clinician notes improvements with disruptions while his mother was engaging in session and outward expression of irritability.  However, Patient continues to demonstrate agitation in session.  Patient also presented as hyperverbal in session and needed redirection multiple times.   The patient attended the appointment with his mother. The clinician spent the first half of the session reviewing the patient's progress, as documented in the progress notes above.  The clinician re-administered the PHQ-9 and GAD-7 assessments. The patient's anxiety scores increased from 2 to 4, while his depression scores decreased from 19 to 4. The patient noted that he has been able to focus at work and is hopeful that his long-acting medication will assist with task completion at home.  During the session, the patient reflected on a recent holiday at the beach and his adherence to hotel rules.  He also discussed his increased work hours and reported using deep breathing techniques to help manage his emotions.  The patient and CG shared that he had switched to long-acting ADHD medication for a week, and he and his mother have already noticed improvements. He reflected on how various sensory stressors have been interfering with his hobbies.  The session explored the triggers for his anger, including slow-moving cars in front of him, having to wait to eat, impatience, and doctor's appointments. He also reflected on his coping strategies for dealing with changes to his routine.   Suicidal/Homicidal: Nowithout intent/plan  Therapist Response: Cln utilized active and supportive reflection to create a safe space for the patient to process recent life events. Cln assessed for current stressors, symptoms, and safety since the last session. Clinician readministered the PHQ-9 and GAD-7.  Updated patient's treatment plan based on reported progress from the patient and his  mother.  Reflected on use of coping mechanisms to manage emotional liability.  Worked with patient and caregiver to identify triggers for anger and supported patient as he identified solutions he was interested in trying to manage stressors.  In an effort to improve compliance with use of coping mechanisms, clinician shared numerous breathing technique videos with the patient for ease of accessibility.  Plan: Return again in 2 weeks.  Diagnosis: Bipolar disorder, in full remission, most recent episode mixed  Attention deficit hyperactivity disorder (ADHD), combined type  Mild intellectual disability   Collaboration of Care: AEB psychiatrist can access notes and cln. Will review psychiatrists' notes. Check in with the patient and will see LCSW per availability. Patient agreed with treatment recommendations.   Patient/Guardian was advised Release of Information must be obtained prior to any record release in order to collaborate their care with an outside provider. Patient/Guardian was advised if they have not already done so to contact the registration department to sign all necessary forms in order for us  to release information regarding their care.   Consent:  Patient/Guardian gives verbal consent for treatment and assignment of benefits for services provided during this visit. Patient/Guardian expressed understanding and agreed to proceed.   Anthony KATHEE Husband, LCSW 02/04/2024  "

## 2024-02-19 ENCOUNTER — Ambulatory Visit (INDEPENDENT_AMBULATORY_CARE_PROVIDER_SITE_OTHER): Admitting: Licensed Clinical Social Worker

## 2024-02-19 DIAGNOSIS — F3178 Bipolar disorder, in full remission, most recent episode mixed: Secondary | ICD-10-CM

## 2024-02-19 DIAGNOSIS — F70 Mild intellectual disabilities: Secondary | ICD-10-CM

## 2024-02-19 DIAGNOSIS — F902 Attention-deficit hyperactivity disorder, combined type: Secondary | ICD-10-CM | POA: Diagnosis not present

## 2024-02-19 NOTE — Progress Notes (Signed)
 "  THERAPIST PROGRESS NOTE  Session Time: 2-2:52pm  Participation Level: Active  Behavioral Response: CasualAlertEnergetic  Type of Therapy: Family Therapy   Treatment Goals addressed:  Active     Anger Management     LTG: [name] will reduce the amount of anger-related incidents/outbursts by 50% as evidenced by self-report (Progressing)     Start:  10/09/23    Expected End:  06/04/24       Goal Note     02/04/24: Both the caregiver and patient report anger has improved as a result of changes to medications.  Caregiver and patient identified triggers for his anger to include slow-moving cars while he is riding his bike, having to wait on others, and changes to his routine specifically related to doctors appointments.  Patient was able to identify solutions to improving readiness for doctors appointments.         STG: [name] will identify situations, thoughts, and feelings that trigger internal anger, and/or angry/aggressive actions as evidenced by self-report (Progressing)     Start:  10/09/23    Expected End:  06/04/24         Educate [name] on anger management skills and the rationale for learning these skills     Start:  10/09/23         Educate [name] on relaxation techniques and the rationale for learning these techniques     Start:  10/09/23         Educate [name] on trigger thoughts (thoughts that trigger an anger response)     Start:  10/09/23           Social Interpersonal Effectiveness     LTG: [name] will recognize socially inappropriate behaviors and develop alternative behaviors (Not Progressing)     Start:  10/09/23    Expected End:  06/04/24         STG: Improve Relationships: Develop more effective communication and relationship skills, measured through self-reports and CG reports. (Not Progressing)     Start:  10/09/23    Expected End:  06/04/24       Goal Note     02/04/24: Clinician continues to observe patient engaging in behaviors  representative of feelings of frustration related to communication with his mother.  Clinician also observes patient asking his mother to refrain from communicating sometimes in a nonpolite manner.         Encourage and recognize when [name] displays appropriate boundaries and behaviors     Start:  10/09/23         Educate [name] on appropriate behaviors and boundaries     Start:  10/09/23         Facilitate discussions with [name] regarding miscommunications that occur during social interactions     Start:  10/09/23            ProgressTowards Goals: Progressing  Interventions: Solution Focused and Supportive  Summary: [name] is a 29 year old who reports a history of anger issues, ADHD ,mild intellectual disability, seizures ( last one was at the age of 71 ). Patient's mother [name] -provided collateral information-mother reported she is the legal guardian for patient. Pt was oriented times 5. Pt was cooperative and engaged. Pt denies SI/HI/AVH. Clinician notes improvements with disruptions while his mother was engaging in session and outward expression of irritability. However, Patient continues to demonstrate agitation in session. Patient also presented as hyperverbal in session and needed redirection multiple times.   The patient expressed excitement about his new involvement in  a bowling league.  He reflected on recent behaviors that led him to feel ashamed, identifying an increased number of episodes at home where he has been rude and raised his voice. He reported that breathing techniques and taking walks outside have helped him. The caregiver mentioned that she feels the patient is often hungry when these outbursts occur. We identified a solution to set an alarm on the patients phone for 4 PM as a reminder for him to have a snack. The caregiver and clinician also discussed additional strategies for the patient to try in order to address his behaviors at home.  We  reflected on setting boundaries at home, as the patient tends to get overly involved in others' business and often tries to control their behavior by redirecting them. The clinician role-played scenarios with the patient to assess lessons learned.  The patient was educated further on selective attention and how to ignore unhealthy behaviors. Discussed the concept of a calming corner for the patient to use for emotional regulation.  For homework, the family was asked to create a list of rules to hang throughout the home that the patient can reference.  Suicidal/Homicidal: Nowithout intent/plan  Therapist Response: Cln utilized active and supportive reflection to create a safe space for the patient to process recent life events. Cln assessed for current stressors, symptoms, and safety since the last session. Worked with the patient and CG to establish solutions to educate patient on alternative behaviors and ways to cope with emotional dysregulation.    Plan: Return again in 2 weeks.   Diagnosis: Bipolar disorder, in full remission, most recent episode mixed   Attention deficit hyperactivity disorder (ADHD), combined type   Mild intellectual disability     Collaboration of Care: AEB psychiatrist can access notes and cln. Will review psychiatrists' notes. Check in with the patient and will see LCSW per availability. Patient agreed with treatment recommendations.   Patient/Guardian was advised Release of Information must be obtained prior to any record release in order to collaborate their care with an outside provider. Patient/Guardian was advised if they have not already done so to contact the registration department to sign all necessary forms in order for us  to release information regarding their care.   Consent: Patient/Guardian gives verbal consent for treatment and assignment of benefits for services provided during this visit. Patient/Guardian expressed understanding and agreed to  proceed.   [name] Anthony Husband, LCSW 02/19/2024  "

## 2024-02-26 ENCOUNTER — Telehealth: Payer: Self-pay

## 2024-02-26 DIAGNOSIS — F902 Attention-deficit hyperactivity disorder, combined type: Secondary | ICD-10-CM

## 2024-02-26 DIAGNOSIS — F3162 Bipolar disorder, current episode mixed, moderate: Secondary | ICD-10-CM

## 2024-02-26 MED ORDER — METHYLPHENIDATE HCL ER 20 MG PO TBCR: 20.0000 mg | EXTENDED_RELEASE_TABLET | Freq: Every day | ORAL | 0 refills | Status: AC

## 2024-02-26 MED ORDER — METHYLPHENIDATE HCL ER 10 MG PO TBCR: 10.0000 mg | EXTENDED_RELEASE_TABLET | Freq: Every day | ORAL | 0 refills | Status: AC

## 2024-02-26 NOTE — Telephone Encounter (Signed)
 Pt left Voicemail requesting: methylphenidate  (METADATE  ER) 20 MG ER tablet methylphenidate  10 MG ER tablet Pharmacy:[id number] - [place] ([place]), [place] - [address]  Last seen:01/28/24 Next apt:  03/31/24

## 2024-02-27 NOTE — Telephone Encounter (Signed)
 This was sent out yesterday and a mychart message sent to patient as well

## 2024-03-04 ENCOUNTER — Encounter: Payer: Self-pay | Admitting: Licensed Clinical Social Worker

## 2024-03-04 ENCOUNTER — Ambulatory Visit: Admitting: Licensed Clinical Social Worker

## 2024-03-04 DIAGNOSIS — F3178 Bipolar disorder, in full remission, most recent episode mixed: Secondary | ICD-10-CM

## 2024-03-04 DIAGNOSIS — F70 Mild intellectual disabilities: Secondary | ICD-10-CM

## 2024-03-04 DIAGNOSIS — F902 Attention-deficit hyperactivity disorder, combined type: Secondary | ICD-10-CM

## 2024-03-04 NOTE — Progress Notes (Signed)
 "  THERAPIST PROGRESS NOTE  Virtual Visit via Video Note  I connected with [name] on 03/04/24 at  2:00 PM EST by a video enabled telemedicine application and verified that I am speaking with the correct person using two identifiers.  Location: Patient: Address on file  Provider: Providers Address   I discussed the limitations of evaluation and management by telemedicine and the availability of in person appointments. The patient expressed understanding and agreed to proceed.  I discussed the assessment and treatment plan with the patient. The patient was provided an opportunity to ask questions and all were answered. The patient agreed with the plan and demonstrated an understanding of the instructions.   The patient was advised to call back or seek an in-person evaluation if the symptoms worsen or if the condition fails to improve as anticipated.  I provided 59 minutes of non-face-to-face time during this encounter.   [name] Husband, LCSW  Session Time: 2:00pm-2:59pm  Participation Level: Active  Behavioral Response: CasualAlertEuthymic and Distractable   Type of Therapy: Individual Therapy  Treatment Goals addressed:  Active     Anger Management     LTG: [name] will reduce the amount of anger-related incidents/outbursts by 50% as evidenced by self-report (Progressing)     Start:  10/09/23    Expected End:  06/04/24       Goal Note     02/04/24: Both the caregiver and patient report anger has improved as a result of changes to medications.  Caregiver and patient identified triggers for his anger to include slow-moving cars while he is riding his bike, having to wait on others, and changes to his routine specifically related to doctors appointments.  Patient was able to identify solutions to improving readiness for doctors appointments.         STG: [name] will identify situations, thoughts, and feelings that trigger internal anger, and/or angry/aggressive  actions as evidenced by self-report (Progressing)     Start:  10/09/23    Expected End:  06/04/24         Educate Anthony on anger management skills and the rationale for learning these skills     Start:  10/09/23         Educate Anthony on relaxation techniques and the rationale for learning these techniques     Start:  10/09/23         Educate [name] on trigger thoughts (thoughts that trigger an anger response)     Start:  10/09/23           Social Interpersonal Effectiveness     LTG: [name] will recognize socially inappropriate behaviors and develop alternative behaviors (Not Progressing)     Start:  10/09/23    Expected End:  06/04/24         STG: Improve Relationships: Develop more effective communication and relationship skills, measured through self-reports and CG reports. (Not Progressing)     Start:  10/09/23    Expected End:  06/04/24       Goal Note     02/04/24: Clinician continues to observe patient engaging in behaviors representative of feelings of frustration related to communication with his mother.  Clinician also observes patient asking his mother to refrain from communicating sometimes in a nonpolite manner.         Encourage and recognize when [name] displays appropriate boundaries and behaviors     Start:  10/09/23         Educate Anthony on appropriate behaviors and  boundaries     Start:  10/09/23         Facilitate discussions with Anthony regarding miscommunications that occur during social interactions     Start:  10/09/23            ProgressTowards Goals: Progressing   Interventions: Solution Focused and Supportive, anger management    Summary: [name] is a 29 year old who reports a history of anger issues, ADHD ,mild intellectual disability, seizures ( last one was at the age of 89 ). Patient's mother [name] -provided collateral information-mother reported she is the legal guardian for patient. Pt was oriented times 5. Pt was  cooperative and engaged. Pt denies SI/HI/AVH. Clinician notes improvements with disruptions while his mother was engaging in session and outward expression of irritability. However, Patient continues to demonstrate agitation in session. Patient also presented as hyperverbal in session and needed redirection multiple times.    He reported that he forgot to work on the lists of rules at home  from the previous session due to the weather.  He shared that he has been working on his behaviors.  His routine has been disrupted because work has interfered with his ability to focus leading to greater episodes of mood liability. His sleep is okay, but his mood and anger continue to be issues. We explored the triggers for his anger at home.  He mentioned that he has been going outside to expend energy but has been unable to do so this week due to the weather.  He also shared that he is working on being mindful of others in the home when engaging in loud hobbies and discussed potential solutions for managing loud noises.  Cln educated him on Turtle Steps and explored recent situations where he could have used these steps to achieve a different outcome. He and his caregiver agreed to practice these skills between sessions. Cg also reports she is mindful of getting with the pt to construct visual lists of rules as explored in previous appointments.   Suicidal/Homicidal: Nowithout intent/plan   Therapist Response: Cln utilized active and supportive reflection to create a safe space for the patient to process recent life events. Cln assessed for current stressors, symptoms, and safety since the last session. Explored solutions to to improve communication with his parents. Educated the patient and CG on turtle steps to assist in anger management.    Plan: Return again in 2 weeks.   Diagnosis: Bipolar disorder, in full remission, most recent episode mixed   Attention deficit hyperactivity disorder (ADHD),  combined type   Mild intellectual disability   Collaboration of Care: AEB psychiatrist can access notes and cln. Will review psychiatrists' notes. Check in with the patient and will see LCSW per availability. Patient agreed with treatment recommendations.   Patient/Guardian was advised Release of Information must be obtained prior to any record release in order to collaborate their care with an outside provider. Patient/Guardian was advised if they have not already done so to contact the registration department to sign all necessary forms in order for us  to release information regarding their care.   Consent: Patient/Guardian gives verbal consent for treatment and assignment of benefits for services provided during this visit. Patient/Guardian expressed understanding and agreed to proceed.   [name] Husband, LCSW 03/04/2024  "

## 2024-03-10 MED ORDER — DIVALPROEX SODIUM 250 MG PO DR TAB: 1250.0000 mg | DELAYED_RELEASE_TABLET | ORAL | 5 refills | Status: AC

## 2024-03-18 ENCOUNTER — Ambulatory Visit: Admitting: Licensed Clinical Social Worker

## 2024-03-31 ENCOUNTER — Ambulatory Visit: Admitting: Psychiatry

## 2024-04-08 ENCOUNTER — Ambulatory Visit: Admitting: Licensed Clinical Social Worker
